# Patient Record
Sex: Female | Born: 1975 | Race: Black or African American | Hispanic: No | Marital: Married | Smoking: Never smoker
Health system: Southern US, Community
[De-identification: ages and names within clinical notes are randomized; demographics above are authoritative.]

## PROBLEM LIST (undated history)

## (undated) DIAGNOSIS — R51 Headache: Secondary | ICD-10-CM

## (undated) DIAGNOSIS — E559 Vitamin D deficiency, unspecified: Secondary | ICD-10-CM

## (undated) DIAGNOSIS — N200 Calculus of kidney: Secondary | ICD-10-CM

## (undated) DIAGNOSIS — M549 Dorsalgia, unspecified: Secondary | ICD-10-CM

## (undated) DIAGNOSIS — R002 Palpitations: Secondary | ICD-10-CM

## (undated) DIAGNOSIS — M719 Bursopathy, unspecified: Secondary | ICD-10-CM

## (undated) DIAGNOSIS — R011 Cardiac murmur, unspecified: Secondary | ICD-10-CM

## (undated) DIAGNOSIS — D649 Anemia, unspecified: Secondary | ICD-10-CM

## (undated) DIAGNOSIS — E162 Hypoglycemia, unspecified: Secondary | ICD-10-CM

## (undated) DIAGNOSIS — K219 Gastro-esophageal reflux disease without esophagitis: Secondary | ICD-10-CM

## (undated) DIAGNOSIS — R519 Headache, unspecified: Secondary | ICD-10-CM

## (undated) HISTORY — DX: Hypoglycemia, unspecified: E16.2

## (undated) HISTORY — DX: Dorsalgia, unspecified: M54.9

## (undated) HISTORY — PX: UTERINE FIBROID SURGERY: SHX826

## (undated) HISTORY — DX: Vitamin D deficiency, unspecified: E55.9

## (undated) HISTORY — DX: Headache: R51

## (undated) HISTORY — DX: Calculus of kidney: N20.0

## (undated) HISTORY — DX: Bursopathy, unspecified: M71.9

## (undated) HISTORY — DX: Headache, unspecified: R51.9

---

## 2001-01-26 ENCOUNTER — Encounter: Payer: Self-pay | Admitting: Emergency Medicine

## 2001-01-26 ENCOUNTER — Emergency Department (HOSPITAL_COMMUNITY): Admission: EM | Admit: 2001-01-26 | Discharge: 2001-01-26 | Payer: Self-pay | Admitting: Emergency Medicine

## 2002-04-23 ENCOUNTER — Emergency Department (HOSPITAL_COMMUNITY): Admission: AD | Admit: 2002-04-23 | Discharge: 2002-04-23 | Payer: Self-pay | Admitting: Emergency Medicine

## 2003-04-15 ENCOUNTER — Emergency Department (HOSPITAL_COMMUNITY): Admission: EM | Admit: 2003-04-15 | Discharge: 2003-04-16 | Payer: Self-pay | Admitting: Emergency Medicine

## 2004-03-27 ENCOUNTER — Inpatient Hospital Stay (HOSPITAL_COMMUNITY): Admission: AD | Admit: 2004-03-27 | Discharge: 2004-03-29 | Payer: Self-pay | Admitting: Obstetrics and Gynecology

## 2004-03-30 ENCOUNTER — Inpatient Hospital Stay (HOSPITAL_COMMUNITY): Admission: AD | Admit: 2004-03-30 | Discharge: 2004-03-30 | Payer: Self-pay | Admitting: Obstetrics & Gynecology

## 2004-04-04 ENCOUNTER — Inpatient Hospital Stay (HOSPITAL_COMMUNITY): Admission: AD | Admit: 2004-04-04 | Discharge: 2004-04-04 | Payer: Self-pay | Admitting: Obstetrics & Gynecology

## 2004-04-06 ENCOUNTER — Inpatient Hospital Stay (HOSPITAL_COMMUNITY): Admission: AD | Admit: 2004-04-06 | Discharge: 2004-04-10 | Payer: Self-pay | Admitting: Obstetrics and Gynecology

## 2004-05-08 ENCOUNTER — Inpatient Hospital Stay (HOSPITAL_COMMUNITY): Admission: AD | Admit: 2004-05-08 | Discharge: 2004-05-10 | Payer: Self-pay | Admitting: Obstetrics and Gynecology

## 2004-05-08 ENCOUNTER — Encounter (INDEPENDENT_AMBULATORY_CARE_PROVIDER_SITE_OTHER): Payer: Self-pay | Admitting: *Deleted

## 2004-10-24 ENCOUNTER — Emergency Department (HOSPITAL_COMMUNITY): Admission: EM | Admit: 2004-10-24 | Discharge: 2004-10-24 | Payer: Self-pay | Admitting: Family Medicine

## 2006-04-10 ENCOUNTER — Encounter: Admission: RE | Admit: 2006-04-10 | Discharge: 2006-04-10 | Payer: Self-pay | Admitting: Internal Medicine

## 2008-03-02 ENCOUNTER — Encounter: Admission: RE | Admit: 2008-03-02 | Discharge: 2008-03-02 | Payer: Self-pay | Admitting: Gastroenterology

## 2008-10-13 ENCOUNTER — Ambulatory Visit (HOSPITAL_COMMUNITY): Admission: RE | Admit: 2008-10-13 | Discharge: 2008-10-13 | Payer: Self-pay | Admitting: Obstetrics and Gynecology

## 2008-10-13 ENCOUNTER — Encounter (INDEPENDENT_AMBULATORY_CARE_PROVIDER_SITE_OTHER): Payer: Self-pay | Admitting: Obstetrics and Gynecology

## 2009-05-12 ENCOUNTER — Ambulatory Visit (HOSPITAL_COMMUNITY): Admission: RE | Admit: 2009-05-12 | Discharge: 2009-05-12 | Payer: Self-pay | Admitting: Obstetrics and Gynecology

## 2010-04-03 LAB — CBC
HCT: 37.7 % (ref 36.0–46.0)
Platelets: 269 10*3/uL (ref 150–400)
RBC: 4.47 MIL/uL (ref 3.87–5.11)

## 2010-04-20 LAB — CBC
HCT: 37.9 % (ref 36.0–46.0)
Hemoglobin: 12.8 g/dL (ref 12.0–15.0)
MCHC: 33.7 g/dL (ref 30.0–36.0)
MCV: 84 fL (ref 78.0–100.0)
Platelets: 271 10*3/uL (ref 150–400)
RBC: 4.51 MIL/uL (ref 3.87–5.11)
RDW: 13.3 % (ref 11.5–15.5)
WBC: 4.9 10*3/uL (ref 4.0–10.5)

## 2010-06-01 NOTE — H&P (Signed)
Erin Costa, Erin Costa                  ACCOUNT NO.:  [id number]   MEDICAL RECORD NO.:  [id number]          PATIENT TYPE:  INP   LOCATION:  9151                          FACILITY:  [facility]   PHYSICIAN:  Richardean Sale, M.D.   DATE OF BIRTH:  08/16/1975   DATE OF ADMISSION:  03/27/2004  DATE OF DISCHARGE:                                HISTORY & PHYSICAL   ADMISSION DIAGNOSIS:  A 32-week intrauterine pregnancy with oligohydramnios.   HISTORY OF PRESENT ILLNESS:  This is a 35 year old African American female,  gravida 1, para 0, at [redacted] weeks gestation, who underwent ultrasound  evaluation today secondary to measuring size less than dates.  The fundal  height has been at 29 cm.  The patient reports good fetal movement.  Denies  loss of fluid, vaginal bleeding or contractions.  On ultrasound today, the  fetal weight was appropriate at 23rd percentile, but the amniotic fluid  index was only 6, which is less than the 3rd percentile for gestational age.  Given this finding, a sterile speculum exam was performed in the office,  which was negative for pooling, negative nitrazine and negative fern.  The  patient is being admitted for oligohydramnios.   Prenatal care has been at [facility] with Maxie Better, M.D., as  the primary physician.   PAST MEDICAL HISTORY:  Migraines.   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   OBSTETRICAL HISTORY:  Gravida 1, para 0.   GYNECOLOGIC HISTORY:  No known abnormal Pap smears or sexually transmitted  infections.   FAMILY HISTORY:  Significant for hypertension, diabetes, Grave's disease and  prostate and lung cancer.  No known birth defects, congenital anomalies,  Down syndrome and cystic fibrosis.   SOCIAL HISTORY:  She is married.  Denies tobacco, alcohol or drugs.   MEDICATIONS:  1.  Zantac 150 mg p.o. b.i.d.  2.  Prenatal vitamins daily.  3.  Magnesium supplement daily.   ALLERGIES:  No known drug  allergies.   PRENATAL LABORATORIES:  Blood type is A positive.  Antibody screen negative.  RPR nonreactive.  Rubella immune.  Hepatitis B surface antigen nonreactive.  HIV nonreactive.  A triple test was declined.  One-hour Glucola was normal.  Anatomic survey normal at 20 weeks.   PHYSICAL EXAMINATION:  GENERAL APPEARANCE:  She is a well-developed, well-  nourished, black female who is in no acute distress.  HEART:  Regular rate and rhythm.  LUNGS:  Clear to auscultation.  ABDOMEN:  Gravid, soft and nontender.  The fundal height is 29.  Fetal heart  tones are heard.  PELVIC:  On sterile speculum exam, there is no pooling.  Negative nitrazine.  Negative fern.  The cervix is fingertip, thick, -2 station and vertex.  EXTREMITIES:  No cyanosis, clubbing or edema and nontender.   ASSESSMENT:  A 35 year old, gravida 1, para 0, black female at [redacted] weeks  gestation with new onset of oligohydramnios.  No evidence of ruptured  membranes.   PLAN:  1.  Recommend hospitalization with bed rest and intravenous fluid therapy  for 48 hours with repeat ultrasound, biophysical profile and Doppler      studies in 48 hours.  2.  Continuous fetal monitoring.      JW/MEDQ  D:  03/27/2004  T:  03/27/2004  Job:  [id number]

## 2010-06-01 NOTE — Discharge Summary (Signed)
Erin Costa, Erin Costa                  ACCOUNT NO.:  [id number]   MEDICAL RECORD NO.:  [id number]          PATIENT TYPE:  INP   LOCATION:  9158                          [facility]  [facility]   PHYSICIAN:  Maxie Better, M.D.DATE OF BIRTH:  01-Mar-1975   DATE OF ADMISSION:  04/06/2004  DATE OF DISCHARGE:  04/10/2004                                 DISCHARGE SUMMARY   ADMISSION DIAGNOSES:  1.  Recurrent oligohydramnios.  2.  Intrauterine gestation at 34 weeks.   DISCHARGE DIAGNOSES:  1.  Recurrent oligohydramnios, improved.  2.  Intrauterine gestation at 58 and four-sevenths weeks.   HISTORY OF PRESENT ILLNESS:  A 35 year old gravida 1 para 0 female at 41  weeks admitted for aggressive IV hydration secondary to recurrent  oligohydramnios. Please see the dictated H&P for specific details.   HOSPITAL COURSE:  The patient was admitted to [facility] for  aggressive IV fluids. She underwent a follow-up ultrasound to assess fluid  volume 48 hours after being hospitalized. The follow-up study showed fluid  had improved to 10.8 which was in the low-normal range. The antepartum  evaluation of the baby showed a reassuring nonstress test. The patient  continued to have her IV fluid hydration due to the fact that she had  previously been admitted for oligohydramnios and sent home in a similar  position. On hospital day #5, home infusion was set up for IV fluids at home  with follow-up amniotic fluid index planned in the office on Friday. The  patient concurred with the plan.   DISPOSITION:  Home.   CONDITION:  Stable.   FOLLOW-UP APPOINTMENT:  [facility] in a couple of days.   DISCHARGE INSTRUCTIONS:  Call for decreased fetal movement, rupture of  membranes, vaginal bleeding. Preterm labor precautions given.      [place]/MEDQ  D:  05/07/2004  T:  05/08/2004  Job:  [id number]

## 2010-06-01 NOTE — H&P (Signed)
Erin Costa, TERLECKI                  ACCOUNT NO.:  [id number]   MEDICAL RECORD NO.:  [id number]          PATIENT TYPE:  INP   LOCATION:  9158                          [facility]ILITY:  [facility]   PHYSICIAN:  Maxie Better, M.D.DATE OF BIRTH:  Sep 25, 1975   DATE OF ADMISSION:  04/06/2004  DATE OF DISCHARGE:                                HISTORY & PHYSICAL   CHIEF COMPLAINT:  Recurrent oligohydramnios.   HISTORY OF PRESENT ILLNESS:  This is a 35 year old, gravida 1, para 0,  married, black female, at 35 weeks' gestation, admitted for aggressive IV  hydration secondary to recurrent oligohydramnios.  The patient was  previously admitted, on March 27, 2004, for at least 48 hours for a similar  problem with resolution of her oligohydramnios.  She presented, on April 06, 2004, for followup of a ultrasound which had shown borderline fluid.  There  was no response to bedrest and oral hydration.  The patient is now being  readmitted for IV fluids.  Her amniotic fluid index was 8.1 which is of the  fifth percentile.  Biophysical profile was 8 out of 8.  The patient has good  fetal movements.  No leakage of fluid.   PRENATAL COURSE:  Otherwise been unremarkable.  Ultrasound, on March 27, 2004, revealed an estimated fetal weight of 1,904 grams which was in the  26th percentile.  Prenatal care is at [facility].  Primary obstetrician  is Maxie Better, M.D.   PRENATAL LABS:  Blood type is A positive.  Antibody screen is negative.  Hemoglobin electrophoresis is normal.  RPR is nonreactive.  Rubella is  immune.  Hepatitis B surface antigen is negative.  HIV test was negative.  GC and Chlamydia culture are negative.  Pap was normal.  Genetic screening  was declined.  One hour glucose test was normal.  Normal anatomic fetal  survey.   PAST MEDICAL HISTORY:  No known drug allergies.   MEDICINES:  Prenatal vitamins, Zantac.   MEDICAL HISTORY:  Migraines.   SURGICAL HISTORY:  Right breast  fibroadenoma removal.   FAMILY HISTORY:  Noncontributory.   SOCIAL HISTORY:  Married, nonsmoker.   REVIEW OF SYSTEMS:  Negative.   PHYSICAL EXAMINATION:  GENERAL:  Well-developed, well-nourished, petite,  black female.  Gravid.  No acute distress.  VITAL SIGNS:  120/66, weight 136 pounds.  SKIN:  Shows no lesions.  HEENT:  Anicteric sclerae.  Pink conjunctivae.  Oropharynx negative.  HEART:  Regular rate and rhythm without murmur.  LUNGS:  Clear to auscultation.  BREASTS:  Soft, nontender, no palpable mass.  ABDOMEN:  Gravid.  PELVIC:  Deferred.  EXTREMITIES:  No edema.   IMPRESSION:  1.  Recurrent oligohydramnios.  2.  Intrauterine gestation at 34 weeks.   PLAN:  1.  Admission.  2.  Aggressive IV hydration.  3.  Fetal surveillance.  4.  Routine labs.  5.  Bed rest with bathroom privileges.      [place]/MEDQ  D:  04/06/2004  T:  04/06/2004  Job:  [id number]

## 2014-04-25 ENCOUNTER — Encounter: Payer: Self-pay | Admitting: Dietician

## 2014-04-25 ENCOUNTER — Encounter: Payer: 59 | Attending: Obstetrics and Gynecology | Admitting: Dietician

## 2014-04-25 VITALS — Ht 62.5 in | Wt 112.5 lb

## 2014-04-25 DIAGNOSIS — E162 Hypoglycemia, unspecified: Secondary | ICD-10-CM | POA: Diagnosis present

## 2014-04-25 DIAGNOSIS — Z713 Dietary counseling and surveillance: Secondary | ICD-10-CM | POA: Diagnosis not present

## 2014-04-25 NOTE — Progress Notes (Signed)
  Medical Nutrition Therapy:  Appt start time: 0930 end time:  1025.   Assessment:  Primary concerns today: Erin Costa is here today since she was referred from her Ob/gyn since her sugar levels were updated. Hgb A1c was 5.7% recently and fasting glucose was 83 mg/dl. States she has a hx of hypoglycemia. Will have shakes of have headaches if she "doesn't eat right". Has blacked out before a long time ago. Tries to eat right but she is "on the run a lot". Might skip breakfast 3 x week and may skip other meals (lunch) too if she is busy. Tends to have hypoglycemia if she has missed meals. Will also have symptoms if she has something sweet. Has a family hx of diabetes.  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 Lives with husband and 3 children. Both she and her husband do the meal shopping/preparation at home.  Has cut back on sweet tea and sugar in coffee. Used to drink about 32 oz of sweet tea.   Preferred Learning Style:  No preference indicated   Learning Readiness:   Ready  MEDICATIONS: see list   DIETARY INTAKE:  Usual eating pattern includes 2-3 meals and 0-3 snacks per day.  Avoided foods include: brussels sprouts, octopus, not much pork or beef    24-hr recall:  B ( AM): coffee with flavored cream and sugar sometimes with splenda with banana, Kashi bar, or skips  Snk ( AM): sometimes might have peanut butter crackers or peanuts, fruit L ( PM): [place] food, goes out, brings lunch (1 x week) Snk ( PM): might have fruit D ( PM): pasta whole grain, rice, grain, vegetable, protein  Snk ( PM): sweets - ice cream, chocolate Beverages: coffee, water with flavor, 16 oz sweet tea  Usual physical activity: taking steps more, might join a running group  Estimated energy needs: 1800 calories 200 g carbohydrates 135 g protein 50 g fat  Progress Towards Goal(s):  In progress.   Nutritional Diagnosis:  [place]-2.1 Inpaired nutrition utilization As related to glucose  metabolism.  As evidenced by Hgb A1c of 5.7% and and hx of hypoglycemia.    Intervention:  Nutrition counseling provided. Plan: Aim to eat 3 meals per day and 2-3 snacks if needed. Plan out meals and snacks at night or morning. Add a protein to breakfast (boiled eggs, yogurt, protein bar).  Have a protein with carb/fruit for snacks. Try having unsweet tea with Splenda or flavored packets to water. Have a sweet along with protein and/or fiber foods (part of a meal). Increase activity wherever your can, start doing some walking at lunch (goal 150 minutes per week). Start running on Saturday morning.    Teaching Method Utilized:  Visual Auditory Hands on  Handouts given during visit include:  MyPlate Handout  15 g CHO Snacks  Meal Card  Barriers to learning/adherence to lifestyle change: busy schedule  Demonstrated degree of understanding via:  Teach Back   Monitoring/Evaluation:  Dietary intake, exercise, and body weight in 4 week(s).

## 2014-04-25 NOTE — Patient Instructions (Addendum)
Aim to eat 3 meals per day and 2-3 snacks if needed. Plan out meals and snacks at night or morning. Add a protein to breakfast (boiled eggs, yogurt, protein bar).  Have a protein with carb/fruit for snacks. Try having unsweet tea with Splenda or flavored packets to water. Have a sweet along with protein and/or fiber foods (part of a meal). Increase activity wherever your can, start doing some walking at lunch (goal 150 minutes per week). Start running on Saturday morning.

## 2014-05-25 ENCOUNTER — Ambulatory Visit: Payer: 59 | Admitting: Dietician

## 2014-10-05 ENCOUNTER — Ambulatory Visit (INDEPENDENT_AMBULATORY_CARE_PROVIDER_SITE_OTHER): Payer: 59 | Admitting: Podiatry

## 2014-10-05 ENCOUNTER — Ambulatory Visit (INDEPENDENT_AMBULATORY_CARE_PROVIDER_SITE_OTHER): Payer: 59

## 2014-10-05 VITALS — BP 105/64 | HR 70 | Resp 16

## 2014-10-05 DIAGNOSIS — M722 Plantar fascial fibromatosis: Secondary | ICD-10-CM

## 2014-10-05 DIAGNOSIS — M79671 Pain in right foot: Secondary | ICD-10-CM

## 2014-10-05 MED ORDER — DICLOFENAC SODIUM 75 MG PO TBEC: 75.0000 mg | DELAYED_RELEASE_TABLET | Freq: Two times a day (BID) | ORAL | Status: DC

## 2014-10-05 MED ORDER — TRIAMCINOLONE ACETONIDE 10 MG/ML IJ SUSP
10.0000 mg | Freq: Once | INTRAMUSCULAR | Status: AC
  Administered 2014-10-05: 10 mg

## 2014-10-05 NOTE — Progress Notes (Signed)
   Subjective:    Patient ID: Erin Costa, female    DOB: 01/16/1975, 39 y.o.   MRN: [id number]  HPI Pt presents with right foot arch and ball of foot pain x 1 week. Pain gets worse when  Prolonged standing.   Review of Systems  All other systems reviewed and are negative.      Objective:   Physical Exam        Assessment & Plan:

## 2014-10-05 NOTE — Patient Instructions (Signed)

## 2014-10-05 NOTE — Progress Notes (Signed)
Subjective:     Patient ID: Erin Costa, female   DOB: Aug 19, 1975, 39 y.o.   MRN: [id number]  HPI patient states I'm getting a lot of pain in my right arch over the last couple weeks and a my left ankle and had some pain and dropped something on it in my left foot was bothering me and I wanted to make sure it was okay. I've had periodic pain in both my feet over the years and I do think I'm probably flatfoot   Review of Systems  All other systems reviewed and are negative.      Objective:   Physical Exam  Constitutional: She is oriented to person, place, and time.  Cardiovascular: Intact distal pulses.   Musculoskeletal: Normal range of motion.  Neurological: She is oriented to person, place, and time.  Skin: Skin is warm.  Nursing note and vitals reviewed.  neurovascular status intact muscle strength adequate range of motion within normal limits. Patient's found to have good digital perfusion is well oriented 3 and moderate depression of the arch bilateral. There is discomfort in the distal plantar fascia before it inserts into the first metatarsal head and there is moderate discomfort in the dorsum of the left foot nondescript in nature. Patient does have adequate capillary fill     Assessment:     Probable distal fasciitis right with fluid buildup and moderate depression of the arch along with sprained left foot secondary to trauma    Plan:     H&P and x-rays of both feet reviewed. Today I did an injection of the distal fascia right 3 mg Kenalog 5 mg Xylocaine and applied fascial brace with instructions on usage. I gave instructions on physical therapy supportive shoes and reappoint in 1 week and we'll consider orthotics therapy

## 2014-10-12 ENCOUNTER — Ambulatory Visit (INDEPENDENT_AMBULATORY_CARE_PROVIDER_SITE_OTHER): Payer: 59 | Admitting: Podiatry

## 2014-10-12 ENCOUNTER — Encounter: Payer: Self-pay | Admitting: Podiatry

## 2014-10-12 VITALS — BP 102/70 | HR 80 | Resp 16

## 2014-10-12 DIAGNOSIS — M722 Plantar fascial fibromatosis: Secondary | ICD-10-CM

## 2014-10-12 NOTE — Progress Notes (Signed)
Subjective:     Patient ID: Erin Costa, female   DOB: 1975-11-29, 39 y.o.   MRN: [id number]  HPI patient presents stating it seems to be some improved but it still gives me trouble if him on it quite a bit   Review of Systems     Objective:   Physical Exam Neurovascular status intact muscle strength adequate with significant discomfort upon deep palpation of the distal fascia but improved from previous visit with depression of the arch noted and a moderately narrow heel noted    Assessment:     Distal plantar fasciitis right secondary to foot structure    Plan:     Advised on the importance of physical therapy and exercises and heat and ice therapy. I then went ahead and scanned for custom orthotics to reduce the stress against the arch bilateral

## 2014-10-21 ENCOUNTER — Encounter: Payer: Self-pay | Admitting: *Deleted

## 2014-10-21 NOTE — Progress Notes (Signed)
Patient ID: Erin Costa, female   DOB: 09-16-1975, 39 y.o.   MRN: [id number] Patient called office to cancel order for orthotics they are not covered by her ins so she does not want them.

## 2016-01-25 DIAGNOSIS — K146 Glossodynia: Secondary | ICD-10-CM | POA: Diagnosis not present

## 2016-01-25 DIAGNOSIS — M542 Cervicalgia: Secondary | ICD-10-CM | POA: Diagnosis not present

## 2016-01-25 DIAGNOSIS — E559 Vitamin D deficiency, unspecified: Secondary | ICD-10-CM | POA: Diagnosis not present

## 2016-01-25 DIAGNOSIS — R202 Paresthesia of skin: Secondary | ICD-10-CM | POA: Diagnosis not present

## 2016-02-16 ENCOUNTER — Other Ambulatory Visit: Payer: Self-pay | Admitting: Obstetrics and Gynecology

## 2016-02-16 ENCOUNTER — Ambulatory Visit
Admission: RE | Admit: 2016-02-16 | Discharge: 2016-02-16 | Disposition: A | Discharge status: Home / Self Care | Payer: 59 | Source: Ambulatory Visit | Attending: Obstetrics and Gynecology | Admitting: Obstetrics and Gynecology

## 2016-02-16 DIAGNOSIS — Z131 Encounter for screening for diabetes mellitus: Secondary | ICD-10-CM | POA: Diagnosis not present

## 2016-02-16 DIAGNOSIS — M549 Dorsalgia, unspecified: Secondary | ICD-10-CM

## 2016-02-16 DIAGNOSIS — R319 Hematuria, unspecified: Secondary | ICD-10-CM

## 2016-02-16 DIAGNOSIS — Z01419 Encounter for gynecological examination (general) (routine) without abnormal findings: Secondary | ICD-10-CM | POA: Diagnosis not present

## 2016-02-16 DIAGNOSIS — R310 Gross hematuria: Secondary | ICD-10-CM | POA: Diagnosis not present

## 2016-02-16 DIAGNOSIS — Z13220 Encounter for screening for lipoid disorders: Secondary | ICD-10-CM | POA: Diagnosis not present

## 2016-02-16 DIAGNOSIS — Z0000 Encounter for general adult medical examination without abnormal findings: Secondary | ICD-10-CM | POA: Diagnosis not present

## 2016-02-21 ENCOUNTER — Encounter: Payer: Self-pay | Admitting: Neurology

## 2016-02-21 ENCOUNTER — Ambulatory Visit (INDEPENDENT_AMBULATORY_CARE_PROVIDER_SITE_OTHER): Payer: 59 | Admitting: Neurology

## 2016-02-21 VITALS — BP 123/72 | HR 92 | Ht 62.0 in | Wt 113.6 lb

## 2016-02-21 DIAGNOSIS — G44209 Tension-type headache, unspecified, not intractable: Secondary | ICD-10-CM

## 2016-02-21 DIAGNOSIS — F5104 Psychophysiologic insomnia: Secondary | ICD-10-CM

## 2016-02-21 DIAGNOSIS — F419 Anxiety disorder, unspecified: Secondary | ICD-10-CM | POA: Diagnosis not present

## 2016-02-21 DIAGNOSIS — G4700 Insomnia, unspecified: Secondary | ICD-10-CM | POA: Insufficient documentation

## 2016-02-21 MED ORDER — TRAZODONE HCL 50 MG PO TABS: 50.0000 mg | ORAL_TABLET | Freq: Every evening | ORAL | 2 refills | Status: DC | PRN

## 2016-02-21 NOTE — Patient Instructions (Signed)
-   your symptoms are most likely due to tension head and stress, anxiety related. No sign of brain structural lesion at this time - no neuro imaging necessary  - relaxation, destress, good sleep are the key to get better - continue follow up with other health providers - recommend trazodone 50mg  at night for sleep aid temporarily.  - follow up in 2 months

## 2016-02-21 NOTE — Progress Notes (Signed)
NEUROLOGY CLINIC NEW PATIENT NOTE  NAME: Erin Costa DOB: 1975-12-13 REFERRING PHYSICIAN: Leighton Ruff, MD  I saw Erin Costa as a new consult in the neurovascular clinic today regarding  Chief Complaint  Patient presents with  . New Patient (Initial Visit)    New referral from Dr. Zachary George MD OBGYN MD for  right side of tongue is numbness, and right side neck area is numb. Pt sees orthopedic md for Bursitis for left hip, and plantar fascitis for left foot  [facility],[place] foot doctor for her left foot  .  HPI: Erin Costa is a 41 y.o. female with PMH of HA, left hip pain and right shoulder pain who presents as a new patient for right tongue and righ throat numbness feeling.   Pt stated that for the last several weeks, she felt right lateral tongue numbness feeling, not whole tongue but just lateral edge of the tongue. When she swallow, she can feel food down to left throat but not right throat, no choking, no speech or taste difficulty. She cannot describe the abnormal feeling at right throat, seems like things stack at right throat. Not much feeling in the morning after woke up, but as the day goes by, feeling started to appear and lasting the rest of the day.   She also complains of b/l hand/feet tingling, on and off all day long. She also complains of HA. She has long standing mild HA, could be due to allergy or sinusitis, more at frontal and temporal, or at the top of head, take allergy medication will help. However, for the last few months, HA is different, more at the back of head, throbbing, comes and goes, sometimes lasting days. Morning is better and worsening as days go by.   He complains of insomnia. Not able to get good sleep, not able to fall sleep and also not able to maintain sleep. He also has left hip pain, was told to have bursitis, neck pain and yesterday had left back of leg shoot pain. Also has right shoulder pain. 12/2014 she was  diagnosed with left foot fasciitis. Last week had renal US, was told to have renal lesion at left kidney more likely cyst. Had appointment with urology tomorrow.   Went to see PCP and checked Vit D was low but B12 normal. She works as Education officer, museum, and currently training other social workers. Not stressful. Husband has restaurant, she sometimes helps out but most of the time taking care of her children. Denies much stressor.   Denies smoking or illicit drugs. Social drinking.   Past Medical History:  Diagnosis Date  . Back pain   . Bursitis    to left hip  . Headache   . Hypoglycemia   . Vitamin D deficiency    Past Surgical History:  Procedure Laterality Date  . UTERINE FIBROID SURGERY     Family History  Problem Relation Age of Onset  . Stroke Mother   . Dementia Maternal Aunt   . Stroke Maternal Grandmother    Current Outpatient Prescriptions  Medication Sig Dispense Refill  . cetirizine (ZYRTEC) 10 MG tablet Take 10 mg by mouth daily.    . Cromolyn Sodium (NASAL ALLERGY NA) Place into the nose as needed.     Marland Kitchen levonorgestrel (MIRENA) 20 MCG/24HR IUD 1 each by Intrauterine route once.    . tacrolimus (PROTOPIC) 0.1 % ointment Apply topically 2 (two) times daily.    . Vitamin  D, Ergocalciferol, (DRISDOL) 50000 units CAPS capsule     . traZODone (DESYREL) 50 MG tablet Take 1 tablet (50 mg total) by mouth at bedtime as needed for sleep. 15 tablet 2   No current facility-administered medications for this visit.    Allergies  Allergen Reactions  . Dovonex [Calcipotriene]     rash  . Other     Dovinex cream, reaction   Social History   Social History  . Marital status: Married    Spouse name: N/A  . Number of children: N/A  . Years of education: N/A   Occupational History  . Not on file.   Social History Main Topics  . Smoking status: Never Smoker  . Smokeless tobacco: Never Used  . Alcohol use 0.6 oz/week    1 Glasses of wine per week     Comment: wine  occasionally  . Drug use: No  . Sexual activity: Not on file   Other Topics Concern  . Not on file   Social History Narrative  . No narrative on file    Review of Systems Full 14 system review of systems performed and notable only for those listed, all others are neg:  Constitutional:  fatigue Cardiovascular: palpitations Ear/Nose/Throat:   Skin: itching Eyes:   Respiratory:   Gastroitestinal:   Genitourinary: blood in urine Hematology/Lymphatic:   Endocrine: feeling hot Musculoskeletal:  Joint pain Allergy/Immunology:  allergies Neurological:  HA, numbness Psychiatric:  Sleep: insomnia   Physical Exam  Vitals:   02/21/16 1359  BP: 123/72  Pulse: 92    General - Well nourished, well developed, in no apparent distress.  Ophthalmologic - Sharp disc margins OU.  Cardiovascular - Regular rate and rhythm with no murmur. Carotid pulses were 2+ without bruits .   Neck - supple, no nuchal rigidity .  Mental Status -  Level of arousal and orientation to time, place, and person were intact. Language including expression, naming, repetition, comprehension, reading, and writing was assessed and found intact. Attention span and concentration were normal. Recent and remote memory were intact. Fund of Knowledge was assessed and was intact.  Cranial Nerves II - XII - II - Visual field intact OU. III, IV, VI - Extraocular movements intact. V - Facial sensation intact bilaterally. VII - Facial movement intact bilaterally. VIII - Hearing & vestibular intact bilaterally. X - Palate elevates symmetrically. XI - Chin turning & shoulder shrug intact bilaterally. XII - Tongue protrusion intact.  Motor Strength - The patient's strength was normal in all extremities and pronator drift was absent.  Bulk was normal and fasciculations were absent.   Motor Tone - Muscle tone was assessed at the neck and appendages and was normal.  Reflexes - The patient's reflexes were normal in  all extremities and she had no pathological reflexes.  Sensory - Light touch, temperature/pinprick were assessed and were normal.    Coordination - The patient had normal movements in the hands and feet with no ataxia or dysmetria.  Tremor was absent.  Gait and Station - The patient's transfers, posture, gait, station, and turns were observed as normal.   Imaging None   Lab Review Vit D 17.7 (low), AST/ALT neg, HDL 87, LDL 79, total chol 176, A1C 5.7   Assessment and Plan:   In summary, Erin Costa is a 41 y.o. female with PMH of HA, left hip pain and right shoulder pain who presents as a new patient for right tongue and righ throat numbness feeling as  well as back of head pain. Her symptoms are not fit to neuroanatomy, along with tension HA and symptoms better in am and worse at pm, her complains more like stress, anxiety related. Her stressor likely multifactorial including left hip pain, left leg pain, right shoulder pain, insomnia, stress from children, HA, renal lesion etc. No neuro imaging needed at this time.  - symptoms are most likely due to tension head and stress, anxiety related. Normal neuro exam, no sign of brain structural lesion at this time - no neuro imaging necessary  - relaxation, destress, good sleep are the key to get better - continue follow up with other health providers - recommend trazodone 50mg  at night for sleep aid temporarily.  - follow up in 2 months   Thank you very much for the opportunity to participate in the care of this patient.  Please do not hesitate to call if any questions or concerns arise.  No orders of the defined types were placed in this encounter.   Meds ordered this encounter  Medications  . Vitamin D, Ergocalciferol, (DRISDOL) 50000 units CAPS capsule  . traZODone (DESYREL) 50 MG tablet    Sig: Take 1 tablet (50 mg total) by mouth at bedtime as needed for sleep.    Dispense:  15 tablet    Refill:  2    Patient Instructions  -  your symptoms are most likely due to tension head and stress, anxiety related. No sign of brain structural lesion at this time - no neuro imaging necessary  - relaxation, destress, good sleep are the key to get better - continue follow up with other health providers - recommend trazodone 50mg  at night for sleep aid temporarily.  - follow up in 2 months    Rosalin Hawking, MD PhD [facility] [address], [place], [place] [address] [phone]

## 2016-02-22 ENCOUNTER — Other Ambulatory Visit: Payer: Self-pay | Admitting: Urology

## 2016-02-22 DIAGNOSIS — R310 Gross hematuria: Secondary | ICD-10-CM | POA: Diagnosis not present

## 2016-02-22 DIAGNOSIS — D3000 Benign neoplasm of unspecified kidney: Secondary | ICD-10-CM

## 2016-02-29 ENCOUNTER — Ambulatory Visit (HOSPITAL_COMMUNITY)
Admission: RE | Admit: 2016-02-29 | Discharge: 2016-02-29 | Disposition: A | Discharge status: Home / Self Care | Payer: 59 | Source: Ambulatory Visit | Attending: Urology | Admitting: Urology

## 2016-02-29 DIAGNOSIS — N289 Disorder of kidney and ureter, unspecified: Secondary | ICD-10-CM | POA: Insufficient documentation

## 2016-02-29 DIAGNOSIS — K7689 Other specified diseases of liver: Secondary | ICD-10-CM | POA: Diagnosis not present

## 2016-02-29 DIAGNOSIS — D3000 Benign neoplasm of unspecified kidney: Secondary | ICD-10-CM | POA: Diagnosis present

## 2016-02-29 MED ORDER — GADOBENATE DIMEGLUMINE 529 MG/ML IV SOLN
10.0000 mL | Freq: Once | INTRAVENOUS | Status: AC | PRN
  Administered 2016-02-29: 10 mL via INTRAVENOUS

## 2016-03-01 DIAGNOSIS — R310 Gross hematuria: Secondary | ICD-10-CM | POA: Diagnosis not present

## 2016-04-04 DIAGNOSIS — N3000 Acute cystitis without hematuria: Secondary | ICD-10-CM | POA: Diagnosis not present

## 2016-04-22 ENCOUNTER — Ambulatory Visit: Payer: 59 | Admitting: Nurse Practitioner

## 2016-04-22 ENCOUNTER — Other Ambulatory Visit: Payer: Self-pay | Admitting: Urology

## 2016-04-23 ENCOUNTER — Encounter: Payer: Self-pay | Admitting: Nurse Practitioner

## 2016-04-25 DIAGNOSIS — N3000 Acute cystitis without hematuria: Secondary | ICD-10-CM | POA: Diagnosis not present

## 2016-04-25 DIAGNOSIS — R310 Gross hematuria: Secondary | ICD-10-CM | POA: Diagnosis not present

## 2016-04-25 DIAGNOSIS — R300 Dysuria: Secondary | ICD-10-CM | POA: Diagnosis not present

## 2016-05-07 NOTE — Patient Instructions (Signed)
AANVI VOYLES  05/07/2016   Your procedure is scheduled on: 05/10/2016    Report to [facility] Main  Entrance and take [facility] elevators to 3rd Floor at 1000am.       Call this number if you have problems the morning of surgery [phone]    Remember: ONLY 1 PERSON MAY GO WITH YOU TO SHORT STAY TO GET  READY MORNING OF YOUR SURGERY.  Do not eat food or drink liquids :After Midnight.     Take these medicines the morning of surgery with A SIP OF WATER: Zyrtec, uribel                                 You may not have any metal on your body including hair pins and              piercings  Do not wear jewelry, make-up, lotions, powders or perfumes, deodorant             Do not wear nail polish.  Do not shave  48 hours prior to surgery.                 Do not bring valuables to the hospital. [place].  Contacts, dentures or bridgework may not be worn into surgery.      Patients discharged the day of surgery will not be allowed to drive home.  Name and phone number of your driver:                Please read over the following fact sheets you were given: _____________________________________________________________________             [facility] - Preparing for Surgery Before surgery, you can play an important role.  Because skin is not sterile, your skin needs to be as free of germs as possible.  You can reduce the number of germs on your skin by washing with CHG (chlorahexidine gluconate) soap before surgery.  CHG is an antiseptic cleaner which kills germs and bonds with the skin to continue killing germs even after washing. Please DO NOT use if you have an allergy to CHG or antibacterial soaps.  If your skin becomes reddened/irritated stop using the CHG and inform your nurse when you arrive at Short Stay. Do not shave (including legs and underarms) for at least 48 hours prior to the first CHG shower.  You may  shave your face/neck. Please follow these instructions carefully:  1.  Shower with CHG Soap the night before surgery and the  morning of Surgery.  2.  If you choose to wash your hair, wash your hair first as usual with your  normal  shampoo.  3.  After you shampoo, rinse your hair and body thoroughly to remove the  shampoo.                           4.  Use CHG as you would any other liquid soap.  You can apply chg directly  to the skin and wash                       Gently with a scrungie or clean  washcloth.  5.  Apply the CHG Soap to your body ONLY FROM THE NECK DOWN.   Do not use on face/ open                           Wound or open sores. Avoid contact with eyes, ears mouth and genitals (private parts).                       Wash face,  Genitals (private parts) with your normal soap.             6.  Wash thoroughly, paying special attention to the area where your surgery  will be performed.  7.  Thoroughly rinse your body with warm water from the neck down.  8.  DO NOT shower/wash with your normal soap after using and rinsing off  the CHG Soap.                9.  Pat yourself dry with a clean towel.            10.  Wear clean pajamas.            11.  Place clean sheets on your bed the night of your first shower and do not  sleep with pets. Day of Surgery : Do not apply any lotions/deodorants the morning of surgery.  Please wear clean clothes to the hospital/surgery center.  FAILURE TO FOLLOW THESE INSTRUCTIONS MAY RESULT IN THE CANCELLATION OF YOUR SURGERY PATIENT SIGNATURE_________________________________  NURSE SIGNATURE__________________________________  ________________________________________________________________________

## 2016-05-08 ENCOUNTER — Encounter (HOSPITAL_COMMUNITY): Payer: Self-pay | Admitting: *Deleted

## 2016-05-08 ENCOUNTER — Encounter (HOSPITAL_COMMUNITY)
Admission: RE | Admit: 2016-05-08 | Discharge: 2016-05-08 | Disposition: A | Discharge status: Home / Self Care | Payer: 59 | Source: Ambulatory Visit | Attending: Urology | Admitting: Urology

## 2016-05-08 DIAGNOSIS — K219 Gastro-esophageal reflux disease without esophagitis: Secondary | ICD-10-CM | POA: Diagnosis not present

## 2016-05-08 DIAGNOSIS — F419 Anxiety disorder, unspecified: Secondary | ICD-10-CM | POA: Diagnosis not present

## 2016-05-08 DIAGNOSIS — N200 Calculus of kidney: Secondary | ICD-10-CM | POA: Diagnosis present

## 2016-05-08 HISTORY — DX: Gastro-esophageal reflux disease without esophagitis: K21.9

## 2016-05-08 HISTORY — DX: Anemia, unspecified: D64.9

## 2016-05-08 HISTORY — DX: Palpitations: R00.2

## 2016-05-08 HISTORY — DX: Cardiac murmur, unspecified: R01.1

## 2016-05-08 LAB — CBC
HCT: 35.9 % — ABNORMAL LOW (ref 36.0–46.0)
HEMOGLOBIN: 11.8 g/dL — AB (ref 12.0–15.0)
MCH: 27.6 pg (ref 26.0–34.0)
MCHC: 32.9 g/dL (ref 30.0–36.0)
MCV: 84.1 fL (ref 78.0–100.0)
PLATELETS: 224 10*3/uL (ref 150–400)
RBC: 4.27 MIL/uL (ref 3.87–5.11)
RDW: 13.2 % (ref 11.5–15.5)
WBC: 5.2 10*3/uL (ref 4.0–10.5)

## 2016-05-08 LAB — BASIC METABOLIC PANEL
ANION GAP: 9 (ref 5–15)
BUN: 19 mg/dL (ref 6–20)
CALCIUM: 9 mg/dL (ref 8.9–10.3)
CO2: 24 mmol/L (ref 22–32)
CREATININE: 0.73 mg/dL (ref 0.44–1.00)
Chloride: 105 mmol/L (ref 101–111)
Glucose, Bld: 187 mg/dL — ABNORMAL HIGH (ref 65–99)
Potassium: 3.8 mmol/L (ref 3.5–5.1)
SODIUM: 138 mmol/L (ref 135–145)

## 2016-05-08 LAB — HCG, SERUM, QUALITATIVE: PREG SERUM: NEGATIVE

## 2016-05-10 ENCOUNTER — Ambulatory Visit (HOSPITAL_COMMUNITY): Payer: 59 | Admitting: Anesthesiology

## 2016-05-10 ENCOUNTER — Ambulatory Visit (HOSPITAL_COMMUNITY)
Admission: RE | Admit: 2016-05-10 | Discharge: 2016-05-10 | Disposition: A | Discharge status: Home / Self Care | Payer: 59 | Source: Ambulatory Visit | Attending: Urology | Admitting: Urology

## 2016-05-10 ENCOUNTER — Encounter (HOSPITAL_COMMUNITY)
Admission: RE | Disposition: A | Discharge status: Home / Self Care | Payer: Self-pay | Source: Ambulatory Visit | Attending: Urology

## 2016-05-10 ENCOUNTER — Ambulatory Visit (HOSPITAL_COMMUNITY): Payer: 59

## 2016-05-10 DIAGNOSIS — G4700 Insomnia, unspecified: Secondary | ICD-10-CM | POA: Diagnosis not present

## 2016-05-10 DIAGNOSIS — N200 Calculus of kidney: Secondary | ICD-10-CM | POA: Diagnosis not present

## 2016-05-10 DIAGNOSIS — F419 Anxiety disorder, unspecified: Secondary | ICD-10-CM | POA: Insufficient documentation

## 2016-05-10 DIAGNOSIS — K219 Gastro-esophageal reflux disease without esophagitis: Secondary | ICD-10-CM | POA: Diagnosis not present

## 2016-05-10 DIAGNOSIS — R51 Headache: Secondary | ICD-10-CM | POA: Diagnosis not present

## 2016-05-10 HISTORY — PX: CYSTOSCOPY WITH RETROGRADE PYELOGRAM, URETEROSCOPY AND STENT PLACEMENT: SHX5789

## 2016-05-10 SURGERY — CYSTOURETEROSCOPY, WITH RETROGRADE PYELOGRAM AND STENT INSERTION
Anesthesia: General | Laterality: Bilateral

## 2016-05-10 MED ORDER — MIDAZOLAM HCL 5 MG/5ML IJ SOLN
INTRAMUSCULAR | Status: DC | PRN
  Administered 2016-05-10: 2 mg via INTRAVENOUS

## 2016-05-10 MED ORDER — LIDOCAINE 2% (20 MG/ML) 5 ML SYRINGE
INTRAMUSCULAR | Status: DC | PRN
  Administered 2016-05-10: 100 mg via INTRAVENOUS

## 2016-05-10 MED ORDER — OXYCODONE-ACETAMINOPHEN 5-325 MG PO TABS
1.0000 | ORAL_TABLET | Freq: Once | ORAL | Status: AC
  Administered 2016-05-10: 1 via ORAL
  Filled 2016-05-10: qty 1

## 2016-05-10 MED ORDER — OXYCODONE-ACETAMINOPHEN 5-325 MG PO TABS: 1.0000 | ORAL_TABLET | ORAL | 0 refills | Status: DC | PRN

## 2016-05-10 MED ORDER — PROPOFOL 10 MG/ML IV BOLUS
INTRAVENOUS | Status: DC | PRN
  Administered 2016-05-10: 100 mg via INTRAVENOUS

## 2016-05-10 MED ORDER — IOHEXOL 300 MG/ML  SOLN
INTRAMUSCULAR | Status: DC | PRN
  Administered 2016-05-10: 10 mL via URETHRAL

## 2016-05-10 MED ORDER — LIDOCAINE 2% (20 MG/ML) 5 ML SYRINGE
INTRAMUSCULAR | Status: AC
  Filled 2016-05-10: qty 5

## 2016-05-10 MED ORDER — FENTANYL CITRATE (PF) 100 MCG/2ML IJ SOLN
INTRAMUSCULAR | Status: AC
  Filled 2016-05-10: qty 2

## 2016-05-10 MED ORDER — LACTATED RINGERS IV SOLN
INTRAVENOUS | Status: DC
  Administered 2016-05-10: 11:00:00 via INTRAVENOUS

## 2016-05-10 MED ORDER — HYDROMORPHONE HCL 1 MG/ML IJ SOLN
INTRAMUSCULAR | Status: AC
  Filled 2016-05-10: qty 1

## 2016-05-10 MED ORDER — PROMETHAZINE HCL 25 MG/ML IJ SOLN: 6.2500 mg | INTRAMUSCULAR | Status: DC | PRN

## 2016-05-10 MED ORDER — ONDANSETRON HCL 4 MG/2ML IJ SOLN
INTRAMUSCULAR | Status: AC
  Filled 2016-05-10: qty 2

## 2016-05-10 MED ORDER — MIDAZOLAM HCL 2 MG/2ML IJ SOLN
INTRAMUSCULAR | Status: AC
  Filled 2016-05-10: qty 2

## 2016-05-10 MED ORDER — SCOPOLAMINE 1 MG/3DAYS TD PT72
MEDICATED_PATCH | TRANSDERMAL | Status: AC
  Filled 2016-05-10: qty 1

## 2016-05-10 MED ORDER — HYDROMORPHONE HCL 1 MG/ML IJ SOLN
0.2500 mg | INTRAMUSCULAR | Status: DC | PRN
Start: 2016-05-10 — End: 2016-05-10
  Administered 2016-05-10 (×3): 0.5 mg via INTRAVENOUS

## 2016-05-10 MED ORDER — MEPERIDINE HCL 50 MG/ML IJ SOLN: 6.2500 mg | INTRAMUSCULAR | Status: DC | PRN

## 2016-05-10 MED ORDER — SCOPOLAMINE 1 MG/3DAYS TD PT72
1.0000 | MEDICATED_PATCH | TRANSDERMAL | Status: DC
  Administered 2016-05-10: 1.5 mg via TRANSDERMAL

## 2016-05-10 MED ORDER — FENTANYL CITRATE (PF) 100 MCG/2ML IJ SOLN
INTRAMUSCULAR | Status: DC | PRN
  Administered 2016-05-10: 50 ug via INTRAVENOUS

## 2016-05-10 MED ORDER — CEFAZOLIN SODIUM-DEXTROSE 2-4 GM/100ML-% IV SOLN
2.0000 g | INTRAVENOUS | Status: AC
  Administered 2016-05-10: 2 g via INTRAVENOUS
  Filled 2016-05-10: qty 100

## 2016-05-10 MED ORDER — HYDROMORPHONE HCL 1 MG/ML IJ SOLN
INTRAMUSCULAR | Status: AC
  Administered 2016-05-10: 0.5 mg via INTRAVENOUS
  Filled 2016-05-10: qty 1

## 2016-05-10 MED ORDER — DEXAMETHASONE SODIUM PHOSPHATE 10 MG/ML IJ SOLN
INTRAMUSCULAR | Status: AC
  Filled 2016-05-10: qty 1

## 2016-05-10 MED ORDER — PROPOFOL 10 MG/ML IV BOLUS
INTRAVENOUS | Status: AC
  Filled 2016-05-10: qty 20

## 2016-05-10 MED ORDER — SODIUM CHLORIDE 0.9 % IR SOLN
Status: DC | PRN
  Administered 2016-05-10: 4000 mL via INTRAVESICAL

## 2016-05-10 MED ORDER — ONDANSETRON HCL 4 MG/2ML IJ SOLN
INTRAMUSCULAR | Status: DC | PRN
  Administered 2016-05-10: 4 mg via INTRAVENOUS

## 2016-05-10 MED ORDER — DEXAMETHASONE SODIUM PHOSPHATE 10 MG/ML IJ SOLN
INTRAMUSCULAR | Status: DC | PRN
  Administered 2016-05-10: 10 mg via INTRAVENOUS

## 2016-05-10 SURGICAL SUPPLY — 29 items
BAG URO CATCHER STRL LF (MISCELLANEOUS) ×2 IMPLANT
BASKET DAKOTA 1.9FR 11X120 (BASKET) IMPLANT
BASKET LASER NITINOL 1.9FR (BASKET) IMPLANT
BSKT STON RTRVL 120 1.9FR (BASKET)
CATH FOLEY LATEX FREE 20FR (CATHETERS)
CATH FOLEY LF 20FR (CATHETERS) IMPLANT
CATH INTERMIT  6FR 70CM (CATHETERS) ×2 IMPLANT
CLOTH BEACON ORANGE TIMEOUT ST (SAFETY) ×2 IMPLANT
COVER SURGICAL LIGHT HANDLE (MISCELLANEOUS) ×1 IMPLANT
EXTRACTOR STONE NITINOL NGAGE (UROLOGICAL SUPPLIES) ×1 IMPLANT
FIBER LASER FLEXIVA 1000 (UROLOGICAL SUPPLIES) IMPLANT
FIBER LASER FLEXIVA 365 (UROLOGICAL SUPPLIES) IMPLANT
FIBER LASER FLEXIVA 550 (UROLOGICAL SUPPLIES) IMPLANT
FIBER LASER TRAC TIP (UROLOGICAL SUPPLIES) IMPLANT
GLOVE BIO SURGEON STRL SZ8 (GLOVE) ×2 IMPLANT
GOWN STRL REUS W/TWL XL LVL3 (GOWN DISPOSABLE) ×2 IMPLANT
GUIDEWIRE ANG ZIPWIRE 038X150 (WIRE) ×2 IMPLANT
GUIDEWIRE STR DUAL SENSOR (WIRE) ×2 IMPLANT
IV NS 1000ML (IV SOLUTION)
IV NS 1000ML BAXH (IV SOLUTION) ×1 IMPLANT
MANIFOLD NEPTUNE II (INSTRUMENTS) ×2 IMPLANT
PACK CYSTO (CUSTOM PROCEDURE TRAY) ×2 IMPLANT
SHEATH ACCESS URETERAL 38CM (SHEATH) IMPLANT
STENT CONTOUR 6FRX26X.038 (STENTS) IMPLANT
SYR 10ML LL (SYRINGE) ×1 IMPLANT
SYR CONTROL 10ML LL (SYRINGE) ×1 IMPLANT
SYRINGE IRR TOOMEY STRL 70CC (SYRINGE) IMPLANT
TUBE FEEDING 8FR 16IN STR KANG (MISCELLANEOUS) IMPLANT
TUBING CONNECTING 10 (TUBING) ×2 IMPLANT

## 2016-05-10 NOTE — Progress Notes (Signed)
Waiting on patient's ride home (husband).

## 2016-05-10 NOTE — Discharge Instructions (Signed)
Ureteroscopy, Care After This sheet gives you information about how to care for yourself after your procedure. Your health care provider may also give you more specific instructions. If you have problems or questions, contact your health care provider. What can I expect after the procedure? After the procedure, it is common to have:  A burning sensation when you urinate.  Blood in your urine.  Mild discomfort in the bladder area or kidney area when urinating.  Needing to urinate more often or urgently. Follow these instructions at home: Medicines   Take over-the-counter and prescription medicines only as told by your health care provider.  If you were prescribed an antibiotic medicine, take it as told by your health care provider. Do not stop taking the antibiotic even if you start to feel better. General instructions    Donot drive for 24 hours if you were given a medicine to help you relax (sedative) during your procedure.  To relieve burning, try taking a warm bath or holding a warm washcloth over your groin.  Drink enough fluid to keep your urine clear or pale yellow.  Drink two 8-ounce glasses of water every hour for the first 2 hours after you get home.  Continue to drink water often at home.  You can eat what you usually do.  Keep all follow-up visits as told by your health care provider. This is important.  If you had a tube placed to keep urine flowing (ureteral stent), ask your health care provider when you need to return to have it removed. Contact a health care provider if:  You have chills or a fever.  You have burning pain for longer than 24 hours after the procedure.  You have blood in your urine for longer than 24 hours after the procedure. Get help right away if:  You have large amounts of blood in your urine.  You have blood clots in your urine.  You have very bad pain.  You have chest pain or trouble breathing.  You are unable to urinate and  you have the feeling of a full bladder. This information is not intended to replace advice given to you by your health care provider. Make sure you discuss any questions you have with your health care provider. Document Released: 01/05/2013 Document Revised: 10/17/2015 Document Reviewed: 10/13/2015 Elsevier Interactive Patient Education  2017 [place] Anesthesia, Adult, Care After These instructions provide you with information about caring for yourself after your procedure. Your health care provider may also give you more specific instructions. Your treatment has been planned according to current medical practices, but problems sometimes occur. Call your health care provider if you have any problems or questions after your procedure. What can I expect after the procedure? After the procedure, it is common to have:  Vomiting.  A sore throat.  Mental slowness. It is common to feel:  Nauseous.  Cold or shivery.  Sleepy.  Tired.  Sore or achy, even in parts of your body where you did not have surgery. Follow these instructions at home: For at least 24 hours after the procedure:   Do not:  Participate in activities where you could fall or become injured.  Drive.  Use heavy machinery.  Drink alcohol.  Take sleeping pills or medicines that cause drowsiness.  Make important decisions or sign legal documents.  Take care of children on your own.  Rest. Eating and drinking   If you vomit, drink water, juice, or soup when you  can drink without vomiting.  Drink enough fluid to keep your urine clear or pale yellow.  Make sure you have little or no nausea before eating solid foods.  Follow the diet recommended by your health care provider. General instructions   Have a responsible adult stay with you until you are awake and alert.  Return to your normal activities as told by your health care provider. Ask your health care provider what activities are safe  for you.  Take over-the-counter and prescription medicines only as told by your health care provider.  If you smoke, do not smoke without supervision.  Keep all follow-up visits as told by your health care provider. This is important. Contact a health care provider if:  You continue to have nausea or vomiting at home, and medicines are not helpful.  You cannot drink fluids or start eating again.  You cannot urinate after 8-12 hours.  You develop a skin rash.  You have fever.  You have increasing redness at the site of your procedure. Get help right away if:  You have difficulty breathing.  You have chest pain.  You have unexpected bleeding.  You feel that you are having a life-threatening or urgent problem. This information is not intended to replace advice given to you by your health care provider. Make sure you discuss any questions you have with your health care provider. Document Released: 04/08/2000 Document Revised: 06/05/2015 Document Reviewed: 12/15/2014 Elsevier Interactive Patient Education  2017 [employer].

## 2016-05-10 NOTE — Transfer of Care (Signed)
Immediate Anesthesia Transfer of Care Note  Patient: Erin Costa  Procedure(s) Performed: Procedure(s): CYSTOSCOPY WITH RETROGRADE PYELOGRAM, URETEROSCOPY, BASKET EXTRACTION OF STONE ON LEFT (Bilateral)  Patient Location: PACU  Anesthesia Type:General  Level of Consciousness: sedated  Airway & Oxygen Therapy: Patient Spontanous Breathing and Patient connected to face mask oxygen  Post-op Assessment: Report given to RN and Post -op Vital signs reviewed and stable  Post vital signs: Reviewed and stable  Last Vitals:  Vitals:   05/10/16 1018  BP: 122/70  Pulse: 65  Resp: 18  Temp: 36.5 C    Last Pain:  Vitals:   05/10/16 1054  TempSrc:   PainSc: 5          Complications: No apparent anesthesia complications

## 2016-05-10 NOTE — Anesthesia Preprocedure Evaluation (Signed)
Anesthesia Evaluation  Patient identified by MRN, date of birth, ID band Patient awake    Reviewed: Allergy & Precautions, NPO status , Patient's Chart, lab work & pertinent test results  Airway Mallampati: II  TM Distance: >3 FB Neck ROM: Full    Dental no notable dental hx.    Pulmonary neg pulmonary ROS,    Pulmonary exam normal breath sounds clear to auscultation       Cardiovascular negative cardio ROS Normal cardiovascular exam+ Valvular Problems/Murmurs  Rhythm:Regular Rate:Normal     Neuro/Psych  Headaches, PSYCHIATRIC DISORDERS Anxiety    GI/Hepatic Neg liver ROS, GERD  ,  Endo/Other  negative endocrine ROS  Renal/GU negative Renal ROS     Musculoskeletal negative musculoskeletal ROS (+)   Abdominal   Peds  Hematology  (+) anemia ,   Anesthesia Other Findings   Reproductive/Obstetrics negative OB ROS                             Anesthesia Physical Anesthesia Plan  ASA: II  Anesthesia Plan: General   Post-op Pain Management:    Induction: Intravenous  Airway Management Planned: LMA  Additional Equipment:   Intra-op Plan:   Post-operative Plan: Extubation in OR  Informed Consent: I have reviewed the patients History and Physical, chart, labs and discussed the procedure including the risks, benefits and alternatives for the proposed anesthesia with the patient or authorized representative who has indicated his/her understanding and acceptance.   Dental advisory given  Plan Discussed with: CRNA  Anesthesia Plan Comments:         Anesthesia Quick Evaluation

## 2016-05-10 NOTE — Anesthesia Postprocedure Evaluation (Signed)
Anesthesia Post Note  Patient: Erin Costa  Procedure(s) Performed: Procedure(s) (LRB): CYSTOSCOPY WITH RETROGRADE PYELOGRAM, URETEROSCOPY, BASKET EXTRACTION OF STONE ON LEFT (Bilateral)  Patient location during evaluation: PACU Anesthesia Type: General Level of consciousness: sedated and patient cooperative Pain management: pain level controlled Vital Signs Assessment: post-procedure vital signs reviewed and stable Respiratory status: spontaneous breathing Cardiovascular status: stable Anesthetic complications: no       Last Vitals:  Vitals:   05/10/16 1311 05/10/16 1315  BP: (!) 111/55 (!) 105/56  Pulse: 78 78  Resp: 16 15  Temp: 36.4 C     Last Pain:  Vitals:   05/10/16 1330  TempSrc:   PainSc: [place]

## 2016-05-10 NOTE — Anesthesia Procedure Notes (Signed)
Procedure Name: LMA Insertion Date/Time: 05/10/2016 12:18 PM Performed by: Lind Covert Pre-anesthesia Checklist: Patient identified, Emergency Drugs available, Suction available, Patient being monitored and Timeout performed Patient Re-evaluated:Patient Re-evaluated prior to inductionOxygen Delivery Method: Circle system utilized Preoxygenation: Pre-oxygenation with 100% oxygen Intubation Type: IV induction LMA: LMA inserted LMA Size: 4.0 Number of attempts: 1 Placement Confirmation: positive ETCO2 and breath sounds checked- equal and bilateral Tube secured with: Tape Dental Injury: Teeth and Oropharynx as per pre-operative assessment

## 2016-05-10 NOTE — H&P (Signed)
Urology Admission H&P  Chief Complaint: gross hematuria  History of Present Illness: Erin Costa is a 41yo with a hx of gross hematuria.  Past Medical History:  Diagnosis Date  . Anemia    hx of   . Back pain   . Bursitis    to left hip  . GERD (gastroesophageal reflux disease)   . Headache   . Heart murmur   . Hypoglycemia   . Hypoglycemia   . Palpitations   . Vitamin D deficiency    Past Surgical History:  Procedure Laterality Date  . UTERINE FIBROID SURGERY      Home Medications:  Prescriptions Prior to Admission  Medication Sig Dispense Refill Last Dose  . cefpodoxime (VANTIN) 200 MG tablet Take 200 mg by mouth 2 (two) times daily. 7 day course started 04-26-16 UTI Is still currently taking   05/09/2016  . cetirizine (ZYRTEC) 10 MG tablet Take 10 mg by mouth daily.   05/10/2016 at 1215  . Melatonin 5 MG CAPS Take 10 mg by mouth at bedtime as needed (sleep).   Past Week at Unknown time  . Meth-Hyo-M Bl-Na Phos-Ph Sal (URIBEL) 118 MG CAPS Take 118 mg by mouth daily.   Past Week at Unknown time  . Rhodiola rosea (RHODIOLA PO) Take by mouth daily. Uses 2 droppers in water   Past Week at Unknown time  . tacrolimus (PROTOPIC) 0.1 % ointment Apply 1 application topically daily.    05/09/2016 at Unknown time  . Vitamin D, Ergocalciferol, (DRISDOL) 50000 units CAPS capsule Take 50,000 Units by mouth every 7 (seven) days. Sunday Stopped for procedure   Past Month at Unknown time  . levonorgestrel (MIRENA) 20 MCG/24HR IUD 1 each by Intrauterine route once.   Taking  . traZODone (DESYREL) 50 MG tablet Take 1 tablet (50 mg total) by mouth at bedtime as needed for sleep. (Patient not taking: Reported on 05/07/2016) 15 tablet 2 Not Taking at Unknown time   Allergies:  Allergies  Allergen Reactions  . Dovonex [Calcipotriene]     rash  . Other     Dovinex cream, reaction    Family History  Problem Relation Age of Onset  . Stroke Mother   . Dementia Maternal Aunt   . Stroke Maternal  Grandmother    Social History:  reports that she has never smoked. She has never used smokeless tobacco. She reports that she drinks about 0.6 oz of alcohol per week . She reports that she does not use drugs.  Review of Systems  Genitourinary: Positive for hematuria.  All other systems reviewed and are negative.   Physical Exam:  Vital signs in last 24 hours: Temp:  [97.7 F (36.5 C)] 97.7 F (36.5 C) (04/27 1018) Pulse Rate:  [65] 65 (04/27 1018) Resp:  [18] 18 (04/27 1018) BP: (122)/(70) 122/70 (04/27 1018) SpO2:  [100 %] 100 % (04/27 1018) Weight:  [50.8 kg (112 lb)] 50.8 kg (112 lb) (04/27 1054) Physical Exam  Constitutional: She is oriented to person, place, and time. She appears well-developed and well-nourished.  HENT:  Head: Normocephalic and atraumatic.  Eyes: EOM are normal. Pupils are equal, round, and reactive to light.  Neck: Normal range of motion. No thyromegaly present.  Cardiovascular: Normal rate and regular rhythm.   Respiratory: Effort normal. No respiratory distress.  GI: Soft. She exhibits no distension.  Musculoskeletal: Normal range of motion. She exhibits no edema.  Neurological: She is alert and oriented to person, place, and time.  Skin:  Skin is warm and dry.  Psychiatric: She has a normal mood and affect. Her behavior is normal. Judgment and thought content normal.    Laboratory Data:  No results found for this or any previous visit (from the past 24 hour(s)). No results found for this or any previous visit (from the past 240 hour(s)). Creatinine:  Recent Labs  05/08/16 1431  CREATININE 0.73   Baseline Creatinine: 0.7  Impression/Assessment:  41yo with gross hematuria  Plan:  The risks/benefits/alternaitves to cystoscopy, bilateral ureteroscopy, possible fulgeration was explained to the patient and she understands and wishes to proceed with surgery  Nicolette Bang 05/10/2016, 12:11 PM

## 2016-05-10 NOTE — Brief Op Note (Signed)
05/10/2016  1:04 PM  PATIENT:  Erin Costa  41 y.o. female  PRE-OPERATIVE DIAGNOSIS:  GROSS HEMATURIA  POST-OPERATIVE DIAGNOSIS:  GROSS HEMATURIA  PROCEDURE:  Procedure(s): CYSTOSCOPY WITH RETROGRADE PYELOGRAM, URETEROSCOPY, BASKET EXTRACTION OF STONE ON LEFT (Bilateral)  SURGEON:  Surgeon(s) and Role:    * Cleon Gustin, MD - Primary  PHYSICIAN ASSISTANT:   ASSISTANTS: none   ANESTHESIA:   general  EBL:  Total I/O In: 700 [I.V.:700] Out: -   BLOOD ADMINISTERED:none  DRAINS: none   LOCAL MEDICATIONS USED:  NONE  SPECIMEN:  Source of Specimen:  left renal calculus  DISPOSITION OF SPECIMEN:  N/A  COUNTS:  YES  TOURNIQUET:  * No tourniquets in log *  DICTATION: .Note written in EPIC  PLAN OF CARE: Discharge to home after PACU  PATIENT DISPOSITION:  PACU - hemodynamically stable.   Delay start of Pharmacological VTE agent (>24hrs) due to surgical blood loss or risk of bleeding: not applicable

## 2016-05-12 ENCOUNTER — Encounter (HOSPITAL_COMMUNITY): Payer: Self-pay

## 2016-05-12 ENCOUNTER — Emergency Department (HOSPITAL_COMMUNITY)
Admission: EM | Admit: 2016-05-12 | Discharge: 2016-05-13 | Disposition: A | Discharge status: Home / Self Care | Payer: 59 | Attending: Emergency Medicine | Admitting: Emergency Medicine

## 2016-05-12 DIAGNOSIS — R319 Hematuria, unspecified: Secondary | ICD-10-CM | POA: Diagnosis present

## 2016-05-12 DIAGNOSIS — R109 Unspecified abdominal pain: Secondary | ICD-10-CM | POA: Diagnosis not present

## 2016-05-12 DIAGNOSIS — R310 Gross hematuria: Secondary | ICD-10-CM | POA: Insufficient documentation

## 2016-05-12 DIAGNOSIS — Z79899 Other long term (current) drug therapy: Secondary | ICD-10-CM | POA: Diagnosis not present

## 2016-05-12 NOTE — ED Provider Notes (Signed)
[facility] DEPT Provider Note   CSN: [id number] Arrival date & time: 05/12/16  2317  By signing my name below, I, Erin Costa, attest that this documentation has been prepared under the direction and in the presence of Erin Essex, MD. Electronically Signed: Oleh Costa, Scribe. 05/12/16. 11:43 PM.   History   Chief Complaint Chief Complaint  Patient presents with  . Hematuria    HPI Erin Costa is a 41 y.o. female with history of anemia who presents to the ED with hematuria. This patient was seen 4 days ago at urology clinic for ongoing hematuria. At the time she had a cystoscopy where she was found to have "several bleeding vessels" and incidentally a renal stone. The stone was removed, however the vessels were not cauterized because "it would do more harm that good", according to the patient's husband. Since that procedure she has had increasing volume of her hematuria. Previously was "very small", however now her urine is grossly blood. She is reporting L flank pain at interview which is worse since her procedure. She is reporting urinary frequency and urgency as well as dysuria. She is not anticoagulated. No vaginal bleeding. She is on Mirena. She is taking percocet at home with mild improvement.   The history is provided by the patient. No language interpreter was used.  Hematuria  This is a new problem. The current episode started more than 2 days ago. The problem has been gradually worsening. Nothing aggravates the symptoms. The symptoms are relieved by narcotics.    Past Medical History:  Diagnosis Date  . Anemia    hx of   . Back pain   . Bursitis    to left hip  . GERD (gastroesophageal reflux disease)   . Headache   . Heart murmur   . Hypoglycemia   . Hypoglycemia   . Palpitations   . Vitamin D deficiency     Patient Active Problem List   Diagnosis Date Noted  . Insomnia 02/21/2016  . Anxiety 02/21/2016  . Tension headache 02/21/2016     Past Surgical History:  Procedure Laterality Date  . UTERINE FIBROID SURGERY      OB History    No data available       Home Medications    Prior to Admission medications   Medication Sig Start Date End Date Taking? Authorizing Provider  cefpodoxime (VANTIN) 200 MG tablet Take 200 mg by mouth 2 (two) times daily. 7 day course started 04-26-16 UTI Is still currently taking    Historical Provider, MD  cetirizine (ZYRTEC) 10 MG tablet Take 10 mg by mouth daily.    Historical Provider, MD  levonorgestrel (MIRENA) 20 MCG/24HR IUD 1 each by Intrauterine route once.    Historical Provider, MD  Melatonin 5 MG CAPS Take 10 mg by mouth at bedtime as needed (sleep).    Historical Provider, MD  Meth-Hyo-M Bl-Na Phos-Ph Sal (URIBEL) 118 MG CAPS Take 118 mg by mouth daily.    Historical Provider, MD  oxyCODONE-acetaminophen (ROXICET) 5-325 MG tablet Take 1 tablet by mouth every 4 (four) hours as needed for severe pain. 05/10/16   Cleon Gustin, MD  Rhodiola rosea (RHODIOLA PO) Take by mouth daily. Uses 2 droppers in water    Historical Provider, MD  tacrolimus (PROTOPIC) 0.1 % ointment Apply 1 application topically daily.     Historical Provider, MD  Vitamin D, Ergocalciferol, (DRISDOL) 50000 units CAPS capsule Take 50,000 Units by mouth every 7 (seven) days. Sunday Stopped  for procedure 02/11/16   Historical Provider, MD    Family History Family History  Problem Relation Age of Onset  . Stroke Mother   . Dementia Maternal Aunt   . Stroke Maternal Grandmother     Social History Social History  Substance Use Topics  . Smoking status: Never Smoker  . Smokeless tobacco: Never Used  . Alcohol use 0.6 oz/week    1 Glasses of wine per week     Comment: wine occasionally     Allergies   Dovonex [calcipotriene] and Other   Review of Systems Review of Systems  Genitourinary: Positive for flank pain, frequency, hematuria and urgency. Negative for vaginal bleeding.  All other  systems reviewed and are negative.    Physical Exam Updated Vital Signs BP 125/75 (BP Location: Left Arm)   Pulse 88   Temp 98.7 F (37.1 C) (Oral)   Resp 18   SpO2 99%   Physical Exam  Constitutional: She is oriented to person, place, and time. She appears well-developed and well-nourished. No distress.  HENT:  Head: Normocephalic and atraumatic.  Mouth/Throat: Oropharynx is clear and moist. No oropharyngeal exudate.  Eyes: Conjunctivae and EOM are normal. Pupils are equal, round, and reactive to light.  Neck: Normal range of motion. Neck supple.  No meningismus.  Cardiovascular: Normal rate, regular rhythm, normal heart sounds and intact distal pulses.   No murmur heard. Pulmonary/Chest: Effort normal and breath sounds normal. No respiratory distress.  Abdominal: Soft. There is no rebound and no guarding.  The suprapubic area is tender with mild distention.   Musculoskeletal: Normal range of motion. She exhibits no edema.  L CVA tenderness.   Neurological: She is alert and oriented to person, place, and time. No cranial nerve deficit. She exhibits normal muscle tone. Coordination normal.   5/5 strength throughout. CN 2-12 intact.Equal grip strength.   Skin: Skin is warm.  Psychiatric: She has a normal mood and affect. Her behavior is normal.  Nursing note and vitals reviewed.    ED Treatments / Results  Labs (all labs ordered are listed, but only abnormal results are displayed) Labs Reviewed  URINALYSIS, ROUTINE W REFLEX MICROSCOPIC - Abnormal; Notable for the following:       Result Value   Color, Urine RED (*)    APPearance HAZY (*)    Hgb urine dipstick LARGE (*)    Protein, ur 100 (*)    Squamous Epithelial / LPF 0-5 (*)    All other components within normal limits  BASIC METABOLIC PANEL - Abnormal; Notable for the following:    BUN 24 (*)    All other components within normal limits  URINE CULTURE  PREGNANCY, URINE  CBC WITH DIFFERENTIAL/PLATELET     EKG  EKG Interpretation None       Radiology Ct Renal Stone Study  Result Date: 05/13/2016 CLINICAL DATA:  Cystoscopy 4 days ago with removal of kidney stone gross hematuria and increased left flank pain EXAM: CT ABDOMEN AND PELVIS WITHOUT CONTRAST TECHNIQUE: Multidetector CT imaging of the abdomen and pelvis was performed following the standard protocol without IV contrast. COMPARISON:  MRI 02/29/2016, CT 03/02/2008 FINDINGS: Lower chest: Lung bases demonstrate no acute consolidation or effusion. Heart size is within normal limits. Hepatobiliary: No focal liver abnormality is seen. MRI demonstrated liver cysts are not well appreciated. No gallstones, gallbladder wall thickening, or biliary dilatation. Pancreas: Unremarkable. No pancreatic ductal dilatation or surrounding inflammatory changes. Spleen: Normal in size without focal abnormality. Adrenals/Urinary Tract: Adrenal  glands are within normal limits. Multiple old stones are present in the right kidney, the largest is seen in the lower pole an measures 6 mm on axial images. No right hydronephrosis. There are punctate stones in the lower pole of the left kidney. There is mild left hydronephrosis and hydroureter, no definitive stones is seen along the course of the left ureter. The bladder is unremarkable. Stomach/Bowel: Stomach is within normal limits. Appendix appears normal. No evidence of bowel wall thickening, distention, or inflammatory changes. Vascular/Lymphatic: No significant vascular findings are present. No enlarged abdominal or pelvic lymph nodes. Reproductive: Intrauterine device is present. 3.4 cm cyst in the right ovary. Other: No free air or free fluid. Musculoskeletal: No acute or significant osseous findings. IMPRESSION: 1. Multiple bilateral intrarenal calculi. Mild left hydronephrosis and hydroureter, however no definitive stones are visualized along the course of the left ureter. Electronically Signed   By: Donavan Foil M.D.    On: 05/13/2016 02:17    Procedures Procedures (including critical care time)  Medications Ordered in ED Medications - No data to display   Initial Impression / Assessment and Plan / ED Course  I have reviewed the triage vital signs and the nursing notes.  Pertinent labs & imaging results that were available during my care of the patient were reviewed by me and considered in my medical decision making (see chart for details).     Patient with worsening hematuria after having cystoscopy on April 27. Has pain and dysuria and frank blood with urination uncontrolled with Percocet at home.  Vitals are stable, no distress. Hemoglobin is stable. Orthostatics are negative.  Urinalysis shows hematuria without infection. Creatinine is normal. Hemoglobin is stable.  CT scan shows multiple kidney stones but no ureteral stones.  Discussed with Dr. Junious Silk of urology. He suspects hematuria due to recent manipulation and possible stone passage. Should improve on its own. Vitals and kidney function are stable.  [employer] reviewed. Patient received 30 Percocet tablets on April 27.  Patient's pain is improved. Tolerating by mouth. She is well-appearing with normal vital signs. She will follow-up with urology. Return precautions discussed.  Final Clinical Impressions(s) / ED Diagnoses   Final diagnoses:  Gross hematuria    New Prescriptions New Prescriptions   No medications on file  I personally performed the services described in this documentation, which was scribed in my presence. The recorded information has been reviewed and is accurate.    Erin Essex, MD 05/13/16 [phone]

## 2016-05-12 NOTE — ED Triage Notes (Signed)
On Friday had blood vessels burned in bladder to stop bleeding and now having more bleeding, and states bil leg swelling states taking percocet for pain but pain comes back when they wear off.

## 2016-05-13 ENCOUNTER — Encounter (HOSPITAL_COMMUNITY): Payer: Self-pay | Admitting: Urology

## 2016-05-13 ENCOUNTER — Emergency Department (HOSPITAL_COMMUNITY): Payer: 59

## 2016-05-13 DIAGNOSIS — R8271 Bacteriuria: Secondary | ICD-10-CM | POA: Diagnosis not present

## 2016-05-13 DIAGNOSIS — R310 Gross hematuria: Secondary | ICD-10-CM | POA: Diagnosis not present

## 2016-05-13 DIAGNOSIS — R1032 Left lower quadrant pain: Secondary | ICD-10-CM | POA: Diagnosis not present

## 2016-05-13 DIAGNOSIS — R109 Unspecified abdominal pain: Secondary | ICD-10-CM | POA: Diagnosis not present

## 2016-05-13 LAB — CBC WITH DIFFERENTIAL/PLATELET
BASOS PCT: 0 %
Basophils Absolute: 0 10*3/uL (ref 0.0–0.1)
EOS ABS: 0.1 10*3/uL (ref 0.0–0.7)
EOS PCT: 2 %
HCT: 36.6 % (ref 36.0–46.0)
HEMOGLOBIN: 12.3 g/dL (ref 12.0–15.0)
Lymphocytes Relative: 38 %
Lymphs Abs: 2.7 10*3/uL (ref 0.7–4.0)
MCH: 28 pg (ref 26.0–34.0)
MCHC: 33.6 g/dL (ref 30.0–36.0)
MCV: 83.4 fL (ref 78.0–100.0)
MONO ABS: 0.6 10*3/uL (ref 0.1–1.0)
MONOS PCT: 9 %
Neutro Abs: 3.6 10*3/uL (ref 1.7–7.7)
Neutrophils Relative %: 51 %
Platelets: 219 10*3/uL (ref 150–400)
RBC: 4.39 MIL/uL (ref 3.87–5.11)
RDW: 13.1 % (ref 11.5–15.5)
WBC: 7 10*3/uL (ref 4.0–10.5)

## 2016-05-13 LAB — BASIC METABOLIC PANEL
ANION GAP: 7 (ref 5–15)
BUN: 24 mg/dL — ABNORMAL HIGH (ref 6–20)
CHLORIDE: 103 mmol/L (ref 101–111)
CO2: 28 mmol/L (ref 22–32)
CREATININE: 0.81 mg/dL (ref 0.44–1.00)
Calcium: 9.9 mg/dL (ref 8.9–10.3)
GFR calc non Af Amer: >60 mL/min (ref >60)
Glucose, Bld: 91 mg/dL (ref 65–99)
Potassium: 4 mmol/L (ref 3.5–5.1)
SODIUM: 138 mmol/L (ref 135–145)

## 2016-05-13 LAB — URINALYSIS, ROUTINE W REFLEX MICROSCOPIC
Bacteria, UA: NONE SEEN
Bilirubin Urine: NEGATIVE
GLUCOSE, UA: NEGATIVE mg/dL
KETONES UR: NEGATIVE mg/dL
Leukocytes, UA: NEGATIVE
NITRITE: NEGATIVE
PROTEIN: 100 mg/dL — AB
Specific Gravity, Urine: 1.014 (ref 1.005–1.030)
pH: 5 (ref 5.0–8.0)

## 2016-05-13 LAB — PREGNANCY, URINE: Preg Test, Ur: NEGATIVE

## 2016-05-13 MED ORDER — ONDANSETRON HCL 4 MG/2ML IJ SOLN
4.0000 mg | Freq: Once | INTRAMUSCULAR | Status: AC
  Administered 2016-05-13: 4 mg via INTRAVENOUS
  Filled 2016-05-13: qty 2

## 2016-05-13 MED ORDER — FENTANYL CITRATE (PF) 100 MCG/2ML IJ SOLN
50.0000 ug | Freq: Once | INTRAMUSCULAR | Status: AC
  Administered 2016-05-13: 50 ug via INTRAVENOUS
  Filled 2016-05-13: qty 2

## 2016-05-13 MED ORDER — IBUPROFEN 600 MG PO TABS: 600.0000 mg | ORAL_TABLET | Freq: Four times a day (QID) | ORAL | 0 refills | Status: DC | PRN

## 2016-05-13 MED ORDER — ONDANSETRON 4 MG PO TBDP: 4.0000 mg | ORAL_TABLET | Freq: Three times a day (TID) | ORAL | 0 refills | Status: DC | PRN

## 2016-05-13 NOTE — Discharge Instructions (Signed)
Call Dr. Noah Delaine for an appointment tomorrow morning. Take your pain medication as prescribed. Return to the ED if you develop fever, vomiting or worsening pain or other concerns.

## 2016-05-14 LAB — URINE CULTURE: Culture: NO GROWTH

## 2016-05-15 NOTE — Op Note (Signed)
.  Preoperative diagnosis: bilateral renal calculi  Postoperative diagnosis: Same  Procedure: 1 cystoscopy 2. Bilateral retrograde pyelography 3.  Intraoperative fluoroscopy, under one hour, with interpretation 4.  Bilateral ureteroscopic stone manipulation with basket extraction  Attending: Rosie Fate  Anesthesia: General  Estimated blood loss: None  Drains: none  Specimens: stone for analysis  Antibiotics: ancef  Findings: bilateral mid and lower pole renal calculi. No hydronephrosis. Right mid pole AVM. No masses/lesions in the bladder. Ureteral orifices in normal anatomic location.  Indications: Patient is a 41 year old female/female with a history of gross hematuria and bilateral flank pain. After discussing treatment options, they decided proceed with bilateral ureteroscopy..  Procedure her in detail: The patient was brought to the operating room and a brief timeout was done to ensure correct patient, correct procedure, correct site.  General anesthesia was administered patient was placed in dorsal lithotomy position.  Her genitalia was then prepped and draped in usual sterile fashion.  A rigid 102 French cystoscope was passed in the urethra and the bladder.  Bladder was inspected free masses or lesions.  the ureteral orifices were in the normal orthotopic locations. a 6 french ureteral catheter was then instilled into the left ureteral orifice.  a gentle retrograde was obtained and findings noted above. We then advanced a zipwire through the catheter and up to the renal pelvis.  we then removed the cystoscope and cannulated the left ureteral orifice with a semirigid ureteroscope.  We located no stone in the ureter. We then placed a sensor wire up to the renal pelvis. We then used the flexible ureteroscope to perform nephroscopy. We located calculi int he mid and lower poles which were removed with an NGage basket.  We then turned out attention to the right side.  6 french ureteral  catheter was then instilled into the right ureteral orifice.  a gentle retrograde was obtained and findings noted above.  We then advanced a zipwire through the catheter and up to the renal pelvis.  we then removed the cystoscope and cannulated the right ureteral orifice with a semirigid ureteroscope.  We located no stone in the ureter. We then placed a sensor wire up to the renal pelvis. We then used the flexible ureteroscope to perform nephroscopy. We located calculi int he mid and lower poles which were removed with an NGage basket.  the bladder was then drained and this concluded the procedure which was well tolerated by patient.  Complications: None  Condition: Stable, extubated, transferred to PACU  Plan: Patient is to be discharged home as to follow-up in 2 weeks.

## 2016-05-27 DIAGNOSIS — R310 Gross hematuria: Secondary | ICD-10-CM | POA: Diagnosis not present

## 2016-05-27 DIAGNOSIS — N200 Calculus of kidney: Secondary | ICD-10-CM | POA: Diagnosis not present

## 2016-07-15 DIAGNOSIS — N200 Calculus of kidney: Secondary | ICD-10-CM | POA: Diagnosis not present

## 2016-08-08 DIAGNOSIS — H43392 Other vitreous opacities, left eye: Secondary | ICD-10-CM | POA: Diagnosis not present

## 2016-08-08 DIAGNOSIS — N200 Calculus of kidney: Secondary | ICD-10-CM | POA: Diagnosis not present

## 2016-08-08 DIAGNOSIS — H40013 Open angle with borderline findings, low risk, bilateral: Secondary | ICD-10-CM | POA: Diagnosis not present

## 2016-08-22 DIAGNOSIS — L720 Epidermal cyst: Secondary | ICD-10-CM | POA: Diagnosis not present

## 2016-08-22 DIAGNOSIS — D225 Melanocytic nevi of trunk: Secondary | ICD-10-CM | POA: Diagnosis not present

## 2016-08-22 DIAGNOSIS — D224 Melanocytic nevi of scalp and neck: Secondary | ICD-10-CM | POA: Diagnosis not present

## 2016-09-17 ENCOUNTER — Other Ambulatory Visit: Payer: Self-pay | Admitting: Gastroenterology

## 2016-09-17 DIAGNOSIS — R1011 Right upper quadrant pain: Secondary | ICD-10-CM

## 2016-09-17 DIAGNOSIS — K5904 Chronic idiopathic constipation: Secondary | ICD-10-CM | POA: Diagnosis not present

## 2016-09-17 DIAGNOSIS — K219 Gastro-esophageal reflux disease without esophagitis: Secondary | ICD-10-CM | POA: Diagnosis not present

## 2016-09-17 NOTE — Progress Notes (Signed)
Erin Thurmon MD 

## 2016-09-25 ENCOUNTER — Ambulatory Visit (HOSPITAL_COMMUNITY)
Admission: RE | Admit: 2016-09-25 | Discharge: 2016-09-25 | Disposition: A | Discharge status: Home / Self Care | Payer: 59 | Source: Ambulatory Visit | Attending: Gastroenterology | Admitting: Gastroenterology

## 2016-09-25 ENCOUNTER — Encounter (HOSPITAL_COMMUNITY): Payer: Self-pay | Admitting: Radiology

## 2016-09-25 DIAGNOSIS — R1011 Right upper quadrant pain: Secondary | ICD-10-CM | POA: Diagnosis present

## 2016-09-25 MED ORDER — MORPHINE SULFATE (PF) 4 MG/ML IV SOLN
INTRAVENOUS | Status: AC
  Administered 2016-09-25: 2 mg
  Filled 2016-09-25: qty 1

## 2016-09-25 MED ORDER — MORPHINE SULFATE (PF) 2 MG/ML IV SOLN
2.0000 mg | Freq: Once | INTRAVENOUS | Status: AC
  Filled 2016-09-25: qty 1

## 2016-09-25 MED ORDER — TECHNETIUM TC 99M MEBROFENIN IV KIT
5.4000 | PACK | Freq: Once | INTRAVENOUS | Status: AC | PRN
  Administered 2016-09-25: 5.4 via INTRAVENOUS

## 2016-10-01 DIAGNOSIS — Z0000 Encounter for general adult medical examination without abnormal findings: Secondary | ICD-10-CM | POA: Diagnosis not present

## 2016-10-10 DIAGNOSIS — Z0000 Encounter for general adult medical examination without abnormal findings: Secondary | ICD-10-CM | POA: Diagnosis not present

## 2016-10-10 DIAGNOSIS — E559 Vitamin D deficiency, unspecified: Secondary | ICD-10-CM | POA: Diagnosis not present

## 2016-10-15 DIAGNOSIS — N200 Calculus of kidney: Secondary | ICD-10-CM | POA: Diagnosis not present

## 2016-10-16 ENCOUNTER — Other Ambulatory Visit: Payer: Self-pay | Admitting: Surgery

## 2016-10-16 DIAGNOSIS — K811 Chronic cholecystitis: Secondary | ICD-10-CM | POA: Diagnosis not present

## 2016-10-24 DIAGNOSIS — M25561 Pain in right knee: Secondary | ICD-10-CM | POA: Diagnosis not present

## 2016-10-24 DIAGNOSIS — M7062 Trochanteric bursitis, left hip: Secondary | ICD-10-CM | POA: Diagnosis not present

## 2016-10-24 DIAGNOSIS — M25562 Pain in left knee: Secondary | ICD-10-CM | POA: Diagnosis not present

## 2016-11-13 DIAGNOSIS — M25561 Pain in right knee: Secondary | ICD-10-CM | POA: Diagnosis not present

## 2016-11-13 DIAGNOSIS — M25562 Pain in left knee: Secondary | ICD-10-CM | POA: Diagnosis not present

## 2016-12-03 DIAGNOSIS — M25562 Pain in left knee: Secondary | ICD-10-CM | POA: Diagnosis not present

## 2016-12-03 DIAGNOSIS — M25561 Pain in right knee: Secondary | ICD-10-CM | POA: Diagnosis not present

## 2016-12-11 DIAGNOSIS — M25562 Pain in left knee: Secondary | ICD-10-CM | POA: Diagnosis not present

## 2016-12-11 DIAGNOSIS — M25561 Pain in right knee: Secondary | ICD-10-CM | POA: Diagnosis not present

## 2016-12-12 DIAGNOSIS — L409 Psoriasis, unspecified: Secondary | ICD-10-CM | POA: Diagnosis not present

## 2016-12-12 DIAGNOSIS — M25551 Pain in right hip: Secondary | ICD-10-CM | POA: Diagnosis not present

## 2016-12-31 DIAGNOSIS — M25561 Pain in right knee: Secondary | ICD-10-CM | POA: Diagnosis not present

## 2016-12-31 DIAGNOSIS — M25562 Pain in left knee: Secondary | ICD-10-CM | POA: Diagnosis not present

## 2017-01-16 DIAGNOSIS — M25562 Pain in left knee: Secondary | ICD-10-CM | POA: Diagnosis not present

## 2017-01-16 DIAGNOSIS — M25561 Pain in right knee: Secondary | ICD-10-CM | POA: Diagnosis not present

## 2017-01-22 DIAGNOSIS — M25561 Pain in right knee: Secondary | ICD-10-CM | POA: Diagnosis not present

## 2017-01-22 DIAGNOSIS — M25562 Pain in left knee: Secondary | ICD-10-CM | POA: Diagnosis not present

## 2017-01-28 DIAGNOSIS — M25562 Pain in left knee: Secondary | ICD-10-CM | POA: Diagnosis not present

## 2017-01-28 DIAGNOSIS — M25561 Pain in right knee: Secondary | ICD-10-CM | POA: Diagnosis not present

## 2017-02-04 DIAGNOSIS — W57XXXA Bitten or stung by nonvenomous insect and other nonvenomous arthropods, initial encounter: Secondary | ICD-10-CM | POA: Diagnosis not present

## 2017-02-04 DIAGNOSIS — S8012XA Contusion of left lower leg, initial encounter: Secondary | ICD-10-CM | POA: Diagnosis not present

## 2017-02-11 DIAGNOSIS — M25561 Pain in right knee: Secondary | ICD-10-CM | POA: Diagnosis not present

## 2017-02-11 DIAGNOSIS — M25562 Pain in left knee: Secondary | ICD-10-CM | POA: Diagnosis not present

## 2017-02-17 DIAGNOSIS — M25562 Pain in left knee: Secondary | ICD-10-CM | POA: Diagnosis not present

## 2017-02-17 DIAGNOSIS — M25561 Pain in right knee: Secondary | ICD-10-CM | POA: Diagnosis not present

## 2017-02-17 DIAGNOSIS — Z6820 Body mass index (BMI) 20.0-20.9, adult: Secondary | ICD-10-CM | POA: Diagnosis not present

## 2017-02-17 DIAGNOSIS — Z01419 Encounter for gynecological examination (general) (routine) without abnormal findings: Secondary | ICD-10-CM | POA: Diagnosis not present

## 2017-02-25 DIAGNOSIS — M25562 Pain in left knee: Secondary | ICD-10-CM | POA: Diagnosis not present

## 2017-02-25 DIAGNOSIS — M25561 Pain in right knee: Secondary | ICD-10-CM | POA: Diagnosis not present

## 2017-03-04 DIAGNOSIS — M25561 Pain in right knee: Secondary | ICD-10-CM | POA: Diagnosis not present

## 2017-03-04 DIAGNOSIS — M25562 Pain in left knee: Secondary | ICD-10-CM | POA: Diagnosis not present

## 2017-03-10 DIAGNOSIS — M2550 Pain in unspecified joint: Secondary | ICD-10-CM | POA: Diagnosis not present

## 2017-03-10 DIAGNOSIS — L409 Psoriasis, unspecified: Secondary | ICD-10-CM | POA: Diagnosis not present

## 2017-03-10 DIAGNOSIS — M7062 Trochanteric bursitis, left hip: Secondary | ICD-10-CM | POA: Diagnosis not present

## 2017-03-20 DIAGNOSIS — K219 Gastro-esophageal reflux disease without esophagitis: Secondary | ICD-10-CM | POA: Diagnosis not present

## 2017-03-20 DIAGNOSIS — R140 Abdominal distension (gaseous): Secondary | ICD-10-CM | POA: Diagnosis not present

## 2017-03-20 DIAGNOSIS — K5904 Chronic idiopathic constipation: Secondary | ICD-10-CM | POA: Diagnosis not present

## 2017-03-21 ENCOUNTER — Other Ambulatory Visit: Payer: Self-pay | Admitting: Gastroenterology

## 2017-03-21 DIAGNOSIS — R1011 Right upper quadrant pain: Secondary | ICD-10-CM

## 2017-04-01 ENCOUNTER — Ambulatory Visit (HOSPITAL_COMMUNITY)
Admission: RE | Admit: 2017-04-01 | Discharge: 2017-04-01 | Disposition: A | Discharge status: Home / Self Care | Payer: 59 | Source: Ambulatory Visit | Attending: Gastroenterology | Admitting: Gastroenterology

## 2017-04-01 DIAGNOSIS — R112 Nausea with vomiting, unspecified: Secondary | ICD-10-CM | POA: Diagnosis not present

## 2017-04-01 DIAGNOSIS — R1011 Right upper quadrant pain: Secondary | ICD-10-CM | POA: Diagnosis present

## 2017-04-01 MED ORDER — TECHNETIUM TC 99M MEBROFENIN IV KIT
5.0000 | PACK | Freq: Once | INTRAVENOUS | Status: AC | PRN
  Administered 2017-04-01: 5 via INTRAVENOUS

## 2017-04-03 DIAGNOSIS — K5904 Chronic idiopathic constipation: Secondary | ICD-10-CM | POA: Diagnosis not present

## 2017-04-03 DIAGNOSIS — R1031 Right lower quadrant pain: Secondary | ICD-10-CM | POA: Diagnosis not present

## 2017-04-03 DIAGNOSIS — K219 Gastro-esophageal reflux disease without esophagitis: Secondary | ICD-10-CM | POA: Diagnosis not present

## 2017-04-08 DIAGNOSIS — M25561 Pain in right knee: Secondary | ICD-10-CM | POA: Diagnosis not present

## 2017-04-08 DIAGNOSIS — M25562 Pain in left knee: Secondary | ICD-10-CM | POA: Diagnosis not present

## 2017-04-10 DIAGNOSIS — M25562 Pain in left knee: Secondary | ICD-10-CM | POA: Diagnosis not present

## 2017-04-10 DIAGNOSIS — M25561 Pain in right knee: Secondary | ICD-10-CM | POA: Diagnosis not present

## 2017-04-15 ENCOUNTER — Telehealth: Payer: Self-pay | Admitting: Neurology

## 2017-04-15 NOTE — Telephone Encounter (Signed)
Sounds a good idea. Thanks, Myriam Jacobson.   Rosalin Hawking, MD PhD Stroke Neurology 04/15/2017 5:25 PM

## 2017-04-15 NOTE — Telephone Encounter (Signed)
That's totally fine, you can schedule her with me thanks

## 2017-04-15 NOTE — Telephone Encounter (Signed)
MS Ledger, is currently a pt of Dr Erlinda Hong and she has followed him for headaches. The pt received the letter in the mail and she is wanting to change over her care with Dr Jaynee Eagles. The patient is the daughter of Donzetta Starch, who is a patient of Dr Jaynee Eagles and she had personally spoke with Dr Jaynee Eagles about having her follow her for her headaches. I have informed the patient that I would send a note to Dr Erlinda Hong, Dr Hale Bogus nurse and that we would call back to schedule once we have gotten the approval. Pt verbalized understanding.

## 2017-04-16 NOTE — Telephone Encounter (Signed)
Spoke with the patient and scheduled her for Thursday 05/29/17 @ 08:30 AM arrival time 08:00 AM. The patient verbalized appreciation. She will call back today if there is any schedule conflict and is aware that r/s should be greater than 24 hrs of appt time.

## 2017-04-21 DIAGNOSIS — M25561 Pain in right knee: Secondary | ICD-10-CM | POA: Diagnosis not present

## 2017-04-21 DIAGNOSIS — M25562 Pain in left knee: Secondary | ICD-10-CM | POA: Diagnosis not present

## 2017-05-07 DIAGNOSIS — M25561 Pain in right knee: Secondary | ICD-10-CM | POA: Diagnosis not present

## 2017-05-07 DIAGNOSIS — M25562 Pain in left knee: Secondary | ICD-10-CM | POA: Diagnosis not present

## 2017-05-29 ENCOUNTER — Ambulatory Visit: Payer: Self-pay | Admitting: Neurology

## 2017-05-29 ENCOUNTER — Encounter: Payer: Self-pay | Admitting: Neurology

## 2017-05-29 ENCOUNTER — Telehealth: Payer: Self-pay | Admitting: *Deleted

## 2017-05-29 NOTE — Telephone Encounter (Signed)
Pt no showed f/u appt on 05/29/2017 @ 08:30.

## 2017-05-29 NOTE — Telephone Encounter (Signed)
Patient had an appt at 8:30 this morning.  She arrived at 8:41 and per MD had to reschedule.  We rescheduled to the first available on July 31.  Patient states she needs to be seen sooner.  Requested a call back.

## 2017-05-30 NOTE — Telephone Encounter (Signed)
Please place her in a work-in spot or a botox spot sometime in middle to late June please

## 2017-06-02 NOTE — Telephone Encounter (Signed)
Called pt and scheduled her for June 10th @ 1:00 pm arrival time 12:30. She verbalized appreciation. She will call back asap if any scheduling conflict arises.

## 2017-06-23 ENCOUNTER — Encounter: Payer: Self-pay | Admitting: Neurology

## 2017-06-23 ENCOUNTER — Ambulatory Visit: Payer: 59 | Admitting: Neurology

## 2017-06-23 VITALS — BP 108/63 | HR 89 | Ht 62.5 in | Wt 120.0 lb

## 2017-06-23 DIAGNOSIS — M5481 Occipital neuralgia: Secondary | ICD-10-CM

## 2017-06-23 DIAGNOSIS — H532 Diplopia: Secondary | ICD-10-CM | POA: Diagnosis not present

## 2017-06-23 DIAGNOSIS — R51 Headache with orthostatic component, not elsewhere classified: Secondary | ICD-10-CM

## 2017-06-23 DIAGNOSIS — G43909 Migraine, unspecified, not intractable, without status migrainosus: Secondary | ICD-10-CM | POA: Insufficient documentation

## 2017-06-23 DIAGNOSIS — H539 Unspecified visual disturbance: Secondary | ICD-10-CM

## 2017-06-23 DIAGNOSIS — G43009 Migraine without aura, not intractable, without status migrainosus: Secondary | ICD-10-CM | POA: Diagnosis not present

## 2017-06-23 DIAGNOSIS — R519 Headache, unspecified: Secondary | ICD-10-CM

## 2017-06-23 DIAGNOSIS — M7918 Myalgia, other site: Secondary | ICD-10-CM

## 2017-06-23 MED ORDER — METHYLPREDNISOLONE 4 MG PO TBPK
ORAL_TABLET | ORAL | 1 refills | Status: DC
Start: 2017-06-23 — End: 2019-02-05

## 2017-06-23 NOTE — Patient Instructions (Addendum)
Occipital Nerve Block: Dr Marcelle Smiling   Occipital Neuralgia Occipital neuralgia is a type of headache that causes episodes of very bad pain in the back of your head. Pain from occipital neuralgia may spread (radiate) to other parts of your head. The pain is usually brief and often goes away after you rest and relax. These headaches may be caused by irritation of the nerves that leave your spinal cord high up in your neck, just below the base of your skull (occipital nerves). Your occipital nerves transmit sensations from the back of your head, the top of your head, and the areas behind your ears. What are the causes? Occipital neuralgia can occur without any known cause (primary headache syndrome). In other cases, occipital neuralgia is caused by pressure on or irritation of one of the two occipital nerves. Causes of occipital nerve compression or irritation include:  Wear and tear of the vertebrae in the neck (osteoarthritis).  Neck injury.  Disease of the disks that separate the vertebrae.  Tumors.  Gout.  Infections.  Diabetes.  Swollen blood vessels that put pressure on the occipital nerves.  Muscle spasm in the neck.  What are the signs or symptoms? Pain is the main symptom of occipital neuralgia. It usually starts in the back of the head but may also be felt in other areas supplied by the occipital nerves. Pain is usually on one side but may be on both sides. You may have:  Brief episodes of very bad pain that is burning, stabbing, shocking, or shooting.  Pain behind the eye.  Pain triggered by neck movement or hair brushing.  Scalp tenderness.  Aching in the back of the head between episodes of very bad pain.  How is this diagnosed? Your health care provider may diagnose occipital neuralgia based on your symptoms and a physical exam. During the exam, the health care provider may push on areas supplied by the occipital nerves to see if they are painful. Some tests  may also be done to help in making the diagnosis. These may include:  Imaging studies of the upper spinal cord, such as an MRI or CT scan. These may show compression or spinal cord abnormalities.  Nerve block. You will get an injection of numbing medicine (local anesthetic) near the occipital nerve to see if this relieves pain.  How is this treated? Treatment may begin with simple measures, such as:  Rest.  Massage.  Heat.  Over-the-counter pain relievers.  If these measures do not work, you may need other treatments, including:  Medicines such as: ? Prescription-strength anti-inflammatory medicines. ? Muscle relaxants. ? Antiseizure medicines. ? Antidepressants.  Steroid injection. This involves injections of local anesthetic and strong anti-inflammatory drugs (steroids).  Pulsed radiofrequency. Wires are implanted to deliver electrical impulses that block pain signals from the occipital nerve.  Physical therapy.  Surgery to relieve nerve pressure.  Follow these instructions at home:  Take all medicines as directed by your health care provider.  Avoid activities that cause pain.  Rest when you have an attack of pain.  Try gentle massage or a heating pad to relieve pain.  Work with a physical therapist to learn stretching exercises you can do at home.  Try a different pillow or sleeping position.  Practice good posture.  Try to stay active. Get regular exercise that does not cause pain. Ask your health care provider to suggest safe exercises for you.  Keep all follow-up visits as directed by your health care provider. This  is important. Contact a health care provider if:  Your medicine is not working.  You have new or worsening symptoms. Get help right away if:  You have very bad head pain that is not going away.  You have a sudden change in vision, balance, or speech. This information is not intended to replace advice given to you by your health care  provider. Make sure you discuss any questions you have with your health care provider. Document Released: 12/25/2000 Document Revised: 06/08/2015 Document Reviewed: 12/23/2012 Elsevier Interactive Patient Education  2017 [employer].

## 2017-06-23 NOTE — Progress Notes (Signed)
[facility]    Provider:  Dr Jaynee Eagles Referring Provider: Leighton Ruff, MD Primary Care Physician:  Leighton Ruff, MD  CC:  headache  HPI:  Erin Costa is a 42 y.o. female here as a referral from Dr. Drema Dallas for headache.  Past medical history of headache left hip pain and right shoulder pain who was seen by my colleague Jindong shoe in February 2018.  At that time she had lateral tongue numbness bilateral hand and foot tingling and headache long-standing mild headache.  The headache was more frontal and temporal or at the top of the head.  Allergy medication would help.  At that time the headache was more at the back of the head throbbing coming and going sometimes lasting days.  Also complained of insomnia.  Left foot fasciitis.  B12 was normal but vitamin D was low.  Headaches started a few months ago. Shooting pain in the back of the head like electricity, severe, happening all day, no inciting events no head trauma, she has chronic neck pain, occipital headache. She went to physical therapy for her neck without relief but not recently, tightness, throbbing, she has reversal or lordosis. Haven't had the pains in a few days. She has a hx of migraines, improved. The occipital headache did not have migrainous qualities. The occipital pain never happened before, no other associated symptoms except neck pain which is chronic, starts gradually then worsens and intensifies. Worse supine an din the morning. She is having vision changes, blurry vision and diplopia.   Reviewed notes, labs and imaging from outside physicians, which showed:  meds tried: zofran. Diclofenac.    Reviewed notes: At that time she had lateral tongue numbness bilateral hand and foot tingling and headache long-standing mild headache.  The headache was more frontal and temporal or at the top of the head.  Allergy medication would help.  At that time the headache was more at the back of the head throbbing  coming and going sometimes lasting days.  Also complained of insomnia.  Left foot fasciitis.  B12 was normal but vitamin D was low.  Neurologic exam was normal.  His diagnosis was more likely stress, anxiety related, tension headache and no neuro imaging was ordered.  He recommended vitamin D and trazodone.    Review of Systems: Patient complains of symptoms per HPI as well as the following symptoms Occipital head pain. Pertinent negatives and positives per HPI. All others negative.   Social History   Socioeconomic History  . Marital status: Married    Spouse name: Not on file  . Number of children: 2  . Years of education: Not on file  . Highest education level: Not on file  Occupational History  . Not on file  Social Needs  . Financial resource strain: Not on file  . Food insecurity:    Worry: Not on file    Inability: Not on file  . Transportation needs:    Medical: Not on file    Non-medical: Not on file  Tobacco Use  . Smoking status: Never Smoker  . Smokeless tobacco: Never Used  Substance and Sexual Activity  . Alcohol use: Yes    Alcohol/week: 0.6 oz    Types: 1 Glasses of wine per week    Comment: wine occasionally  . Drug use: No  . Sexual activity: Not on file  Lifestyle  . Physical activity:    Days per week: Not on file    Minutes per session: Not  on file  . Stress: Not on file  Relationships  . Social connections:    Talks on phone: Not on file    Gets together: Not on file    Attends religious service: Not on file    Active member of club or organization: Not on file    Attends meetings of clubs or organizations: Not on file    Relationship status: Not on file  . Intimate partner violence:    Fear of current or ex partner: Not on file    Emotionally abused: Not on file    Physically abused: Not on file    Forced sexual activity: Not on file  Other Topics Concern  . Not on file  Social History Narrative   Lives at home with husband and 2 children     Right handed   Caffeine: 1 cup coffee daily    Family History  Problem Relation Age of Onset  . Stroke Mother   . Dementia Maternal Aunt   . Stroke Maternal Grandmother     Past Medical History:  Diagnosis Date  . Anemia    hx of   . Back pain   . Bursitis    to left hip  . GERD (gastroesophageal reflux disease)   . Headache   . Heart murmur   . Hypoglycemia   . Hypoglycemia   . Kidney stones    calcium oxalate and one other kind  . Palpitations   . Vitamin D deficiency     Past Surgical History:  Procedure Laterality Date  . CYSTOSCOPY WITH RETROGRADE PYELOGRAM, URETEROSCOPY AND STENT PLACEMENT Bilateral 05/10/2016   Procedure: CYSTOSCOPY WITH RETROGRADE PYELOGRAM, URETEROSCOPY, BASKET EXTRACTION OF STONE ON LEFT;  Surgeon: Cleon Gustin, MD;  Location: [facility];  Service: Urology;  Laterality: Bilateral;  . UTERINE FIBROID SURGERY      Current Outpatient Medications  Medication Sig Dispense Refill  . cetirizine (ZYRTEC) 10 MG tablet Take 10 mg by mouth daily.    Marland Kitchen ibuprofen (ADVIL,MOTRIN) 600 MG tablet Take 1 tablet (600 mg total) by mouth every 6 (six) hours as needed. 30 tablet 0  . levonorgestrel (MIRENA) 20 MCG/24HR IUD 1 each by Intrauterine route once.    . Melatonin 3 MG TABS Take 6 mg by mouth at bedtime as needed.    . ranitidine (ZANTAC) 300 MG tablet Take 300 mg by mouth 2 (two) times daily.  12  . Rhodiola rosea (RHODIOLA PO) Take by mouth daily. Uses 2 droppers in water    . tacrolimus (PROTOPIC) 0.1 % ointment Apply 1 application topically daily.     . methylPREDNISolone (MEDROL DOSEPAK) 4 MG TBPK tablet follow package directions 21 tablet 1   No current facility-administered medications for this visit.     Allergies as of 06/23/2017 - Review Complete 06/23/2017  Allergen Reaction Noted  . Dovonex [calcipotriene]  02/21/2016  . Other  10/05/2014    Vitals: BP 108/63 (BP Location: Right Arm, Patient Position: Sitting)   Pulse 89   Ht 5'  2.5" (1.588 m)   Wt 120 lb (54.4 kg)   BMI 21.60 kg/m  Last Weight:  Wt Readings from Last 1 Encounters:  06/23/17 120 lb (54.4 kg)   Last Height:   Ht Readings from Last 1 Encounters:  06/23/17 5' 2.5" (1.588 m)   Physical exam: Exam: Gen: NAD, conversant, well nourised, well groomed  CV: RRR, no MRG. No Carotid Bruits. No peripheral edema, warm, nontender Eyes: Conjunctivae clear without exudates or hemorrhage  Neuro: Detailed Neurologic Exam  Speech:    Speech is normal; fluent and spontaneous with normal comprehension.  Cognition:    The patient is oriented to person, place, and time;     recent and remote memory intact;     language fluent;     normal attention, concentration,     fund of knowledge Cranial Nerves:    The pupils are equal, round, and reactive to light. The fundi are normal and spontaneous venous pulsations are present. Visual fields are full to finger confrontation. Extraocular movements are intact. Trigeminal sensation is intact and the muscles of mastication are normal. The face is symmetric. The palate elevates in the midline. Hearing intact. Voice is normal. Shoulder shrug is normal. The tongue has normal motion without fasciculations.   Coordination:    Normal finger to nose and heel to shin. Normal rapid alternating movements.   Gait:    Heel-toe and tandem gait are normal.   Motor Observation:    No asymmetry, no atrophy, and no involuntary movements noted. Tone:    Normal muscle tone.    Posture:    Posture is normal. normal erect    Strength:    Strength is V/V in the upper and lower limbs.      Sensation: intact to LT     Reflex Exam:  DTR's:    Deep tendon reflexes in the upper and lower extremities are normal bilaterally.   Toes:    The toes are downgoing bilaterally.   Clonus:    Clonus is absent.       Assessment/Plan:  42 year old with occipital headaches, worse positionally. 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 Rip Harbour at ortho will order Steroid taper if symptoms start again and call for nerve blocks, Megan or Dr. Felecia Shelling can also do them. Talk to [facility].   MRI of the brain due to concerning symptoms of occipital position headache, vision changes to assess for space-occupying mass, chiari, intracranial HTN or other etiology.   Orders Placed This Encounter  Procedures  . MR BRAIN W WO CONTRAST  . Comprehensive metabolic panel  . CBC  . TSH  . Ambulatory referral to Physical Therapy   Meds ordered this encounter  Medications  . methylPREDNISolone (MEDROL DOSEPAK) 4 MG TBPK tablet    Sig: follow package directions    Dispense:  21 tablet    Refill:  [place], MD  [facility] [address] [place], [place] [address]  Phone [phone] Fax [phone]

## 2017-06-24 ENCOUNTER — Telehealth: Payer: Self-pay | Admitting: *Deleted

## 2017-06-24 ENCOUNTER — Ambulatory Visit: Payer: 59

## 2017-06-24 ENCOUNTER — Telehealth: Payer: Self-pay | Admitting: Neurology

## 2017-06-24 DIAGNOSIS — R51 Headache with orthostatic component, not elsewhere classified: Secondary | ICD-10-CM

## 2017-06-24 DIAGNOSIS — H532 Diplopia: Secondary | ICD-10-CM

## 2017-06-24 DIAGNOSIS — H539 Unspecified visual disturbance: Secondary | ICD-10-CM | POA: Diagnosis not present

## 2017-06-24 DIAGNOSIS — R519 Headache, unspecified: Secondary | ICD-10-CM

## 2017-06-24 LAB — CBC
Hematocrit: 36.8 % (ref 34.0–46.6)
Hemoglobin: 12.3 g/dL (ref 11.1–15.9)
MCH: 27.3 pg (ref 26.6–33.0)
MCHC: 33.4 g/dL (ref 31.5–35.7)
MCV: 82 fL (ref 79–97)
PLATELETS: 270 10*3/uL (ref 150–450)
RBC: 4.5 x10E6/uL (ref 3.77–5.28)
RDW: 13.7 % (ref 12.3–15.4)
WBC: 5.3 10*3/uL (ref 3.4–10.8)

## 2017-06-24 LAB — COMPREHENSIVE METABOLIC PANEL
ALK PHOS: 50 IU/L (ref 39–117)
ALT: 13 IU/L (ref 0–32)
AST: 19 IU/L (ref 0–40)
Albumin/Globulin Ratio: 1.6 (ref 1.2–2.2)
Albumin: 4.5 g/dL (ref 3.5–5.5)
BILIRUBIN TOTAL: 0.4 mg/dL (ref 0.0–1.2)
BUN/Creatinine Ratio: 25 — ABNORMAL HIGH (ref 9–23)
BUN: 17 mg/dL (ref 6–24)
CHLORIDE: 105 mmol/L (ref 96–106)
CO2: 22 mmol/L (ref 20–29)
CREATININE: 0.69 mg/dL (ref 0.57–1.00)
Calcium: 9.4 mg/dL (ref 8.7–10.2)
GFR calc Af Amer: 123 mL/min/{1.73_m2} (ref >59)
GFR calc non Af Amer: 107 mL/min/{1.73_m2} (ref >59)
GLUCOSE: 57 mg/dL — AB (ref 65–99)
Globulin, Total: 2.8 g/dL (ref 1.5–4.5)
Potassium: 4.3 mmol/L (ref 3.5–5.2)
SODIUM: 141 mmol/L (ref 134–144)
Total Protein: 7.3 g/dL (ref 6.0–8.5)

## 2017-06-24 LAB — TSH: TSH: 0.768 u[IU]/mL (ref 0.450–4.500)

## 2017-06-24 MED ORDER — GADOPENTETATE DIMEGLUMINE 469.01 MG/ML IV SOLN
11.0000 mL | Freq: Once | INTRAVENOUS | Status: AC | PRN
  Administered 2017-06-24: 11 mL via INTRAVENOUS

## 2017-06-24 NOTE — Telephone Encounter (Signed)
MR Brain w/wo contrast Dr. Jaynee Eagles [facility] Auth: [id number] (exp. 06/24/17 to 08/08/17). Patient is scheduled at [facility] for 06/24/17.

## 2017-06-24 NOTE — Telephone Encounter (Signed)
Spoke with patient and informed her  That her lab results are unremarkable. Advised Dr Jaynee Eagles stated she appears a little dehydrated, and she may consider increasing fluids especially water. She verbalized understanding, stated she had her MRI today. This RN advised she'll get a call in a few days with that result. She verbalized understanding, appreciation of call.

## 2017-06-27 ENCOUNTER — Telehealth: Payer: Self-pay | Admitting: Neurology

## 2017-06-27 NOTE — Telephone Encounter (Signed)
Called the patient and made her aware of the MRI results. Informed her they were normal but she had some sinusitis noted but appeared to be chronic. Pt verbalized understanding. Pt had no questions at this time but was encouraged to call back if questions arise.

## 2017-06-27 NOTE — Telephone Encounter (Signed)
-----   Message from Melvenia Beam, MD sent at 06/27/2017 10:21 AM EDT ----- MRI normal. Some incidental sinusitis chronic seen.

## 2017-07-15 DIAGNOSIS — M5481 Occipital neuralgia: Secondary | ICD-10-CM | POA: Diagnosis not present

## 2017-07-15 DIAGNOSIS — M542 Cervicalgia: Secondary | ICD-10-CM | POA: Diagnosis not present

## 2017-07-16 DIAGNOSIS — L723 Sebaceous cyst: Secondary | ICD-10-CM | POA: Diagnosis not present

## 2017-07-16 DIAGNOSIS — L2089 Other atopic dermatitis: Secondary | ICD-10-CM | POA: Diagnosis not present

## 2017-07-16 DIAGNOSIS — L218 Other seborrheic dermatitis: Secondary | ICD-10-CM | POA: Diagnosis not present

## 2017-07-24 DIAGNOSIS — M5481 Occipital neuralgia: Secondary | ICD-10-CM | POA: Diagnosis not present

## 2017-07-24 DIAGNOSIS — M542 Cervicalgia: Secondary | ICD-10-CM | POA: Diagnosis not present

## 2017-07-28 DIAGNOSIS — M5481 Occipital neuralgia: Secondary | ICD-10-CM | POA: Diagnosis not present

## 2017-07-28 DIAGNOSIS — M542 Cervicalgia: Secondary | ICD-10-CM | POA: Diagnosis not present

## 2017-08-13 ENCOUNTER — Ambulatory Visit: Payer: 59 | Admitting: Neurology

## 2017-08-13 ENCOUNTER — Telehealth: Payer: Self-pay | Admitting: Neurology

## 2017-08-13 ENCOUNTER — Encounter: Payer: Self-pay | Admitting: Neurology

## 2017-08-13 VITALS — BP 128/74 | HR 81 | Ht 62.5 in | Wt 121.0 lb

## 2017-08-13 DIAGNOSIS — R29898 Other symptoms and signs involving the musculoskeletal system: Secondary | ICD-10-CM | POA: Diagnosis not present

## 2017-08-13 DIAGNOSIS — M5412 Radiculopathy, cervical region: Secondary | ICD-10-CM | POA: Diagnosis not present

## 2017-08-13 DIAGNOSIS — M5410 Radiculopathy, site unspecified: Secondary | ICD-10-CM

## 2017-08-13 DIAGNOSIS — R339 Retention of urine, unspecified: Secondary | ICD-10-CM | POA: Diagnosis not present

## 2017-08-13 DIAGNOSIS — M5416 Radiculopathy, lumbar region: Secondary | ICD-10-CM | POA: Diagnosis not present

## 2017-08-13 NOTE — Patient Instructions (Signed)
MRI of the cervical and lumbar spine    Lumbosacral Radiculopathy Lumbosacral radiculopathy is a condition that involves the spinal nerves and nerve roots in the low back and bottom of the spine. The condition develops when these nerves and nerve roots move out of place or become inflamed and cause symptoms. What are the causes? This condition may be caused by:  Pressure from a disk that bulges out of place (herniated disk). A disk is a plate of cartilage that separates bones in the spine.  Disk degeneration.  A narrowing of the bones of the lower back (spinal stenosis).  A tumor.  An infection.  An injury that places sudden pressure on the disks that cushion the bones of your lower spine.  What increases the risk? This condition is more likely to develop in:  Males aged 30-50 years.  Females aged 68-60 years.  People who lift improperly.  People who are overweight or live a sedentary lifestyle.  People who smoke.  People who perform repetitive activities that strain the spine.  What are the signs or symptoms? Symptoms of this condition include:  Pain that goes down from the back into the legs (sciatica). This is the most common symptom. The pain may be worse with sitting, coughing, or sneezing.  Pain and numbness in the arms and legs.  Muscle weakness.  Tingling.  Loss of bladder control or bowel control.  How is this diagnosed? This condition is diagnosed with a physical exam and medical history. If the pain is lasting, you may have tests, such as:  MRI scan.  X-ray.  CT scan.  Myelogram.  Nerve conduction study.  How is this treated? This condition is often treated with:  Hot packs and ice applied to affected areas.  Stretches to improve flexibility.  Exercises to strengthen back muscles.  Physical therapy.  Pain medicine.  A steroid injection in the spine.  In some cases, no treatment is needed. If the condition is long-lasting  (chronic), or if symptoms are severe, treatment may involve surgery or lifestyle changes, such as following a weight loss plan. Follow these instructions at home: Medicines  Take medicines only as directed by your health care provider.  Do not drive or operate heavy machinery while taking pain medicine. Injury care  Apply a heat pack to the injured area as directed by your health care provider.  Apply ice to the affected area: ? Put ice in a plastic bag. ? Place a towel between your skin and the bag. ? Leave the ice on for 20-30 minutes, every 2 hours while you are awake or as needed. Or, leave the ice on for as long as directed by your health care provider. Other Instructions  If you were shown how to do any exercises or stretches, do them as directed by your health care provider.  If your health care provider prescribed a diet or exercise program, follow it as directed.  Keep all follow-up visits as directed by your health care provider. This is important. Contact a health care provider if:  Your pain does not improve over time even when taking pain medicines. Get help right away if:  Your develop severe pain.  Your pain suddenly gets worse.  You develop increasing weakness in your legs.  You lose the ability to control your bladder or bowel.  You have difficulty walking or balancing.  You have a fever. This information is not intended to replace advice given to you by your health care provider.  Make sure you discuss any questions you have with your health care provider. Document Released: 12/31/2004 Document Revised: 06/08/2015 Document Reviewed: 12/27/2013 Elsevier Interactive Patient Education  2018 [employer]. Cervical Radiculopathy Cervical radiculopathy means that a nerve in the neck is pinched or bruised. This can cause pain or loss of feeling (numbness) that runs from your neck to your arm and fingers. Follow these instructions at home: Managing pain  Take  over-the-counter and prescription medicines only as told by your doctor.  If directed, put ice on the injured or painful area. ? Put ice in a plastic bag. ? Place a towel between your skin and the bag. ? Leave the ice on for 20 minutes, 2-3 times per day.  If ice does not help, you can try using heat. Take a warm shower or warm bath, or use a heat pack as told by your doctor.  You may try a gentle neck and shoulder massage. Activity  Rest as needed. Follow instructions from your doctor about any activities to avoid.  Do exercises as told by your doctor or physical therapist. General instructions  If you were given a soft collar, wear it as told by your doctor.  Use a flat pillow when you sleep.  Keep all follow-up visits as told by your doctor. This is important. Contact a doctor if:  Your condition does not improve with treatment. Get help right away if:  Your pain gets worse and is not controlled with medicine.  You lose feeling or feel weak in your hand, arm, face, or leg.  You have a fever.  You have a stiff neck.  You cannot control when you poop or pee (have incontinence).  You have trouble with walking, balance, or talking. This information is not intended to replace advice given to you by your health care provider. Make sure you discuss any questions you have with your health care provider. Document Released: 12/20/2010 Document Revised: 06/08/2015 Document Reviewed: 02/24/2014 Elsevier Interactive Patient Education  [employer].

## 2017-08-13 NOTE — Telephone Encounter (Signed)
[place] authorization [id number] and [id number] (09/27/17). Called patient X1. DW
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
## 2017-08-13 NOTE — Telephone Encounter (Signed)
Pt returning Danielle's call  °

## 2017-08-14 ENCOUNTER — Other Ambulatory Visit: Payer: Self-pay | Admitting: Gastroenterology

## 2017-08-14 DIAGNOSIS — M5481 Occipital neuralgia: Secondary | ICD-10-CM | POA: Diagnosis not present

## 2017-08-14 DIAGNOSIS — R1031 Right lower quadrant pain: Secondary | ICD-10-CM | POA: Diagnosis not present

## 2017-08-14 DIAGNOSIS — K219 Gastro-esophageal reflux disease without esophagitis: Secondary | ICD-10-CM | POA: Diagnosis not present

## 2017-08-14 DIAGNOSIS — M542 Cervicalgia: Secondary | ICD-10-CM | POA: Diagnosis not present

## 2017-08-14 DIAGNOSIS — K5900 Constipation, unspecified: Secondary | ICD-10-CM | POA: Diagnosis not present

## 2017-08-14 NOTE — Telephone Encounter (Signed)
Patient called back and we scheduled imaging. DW

## 2017-08-19 DIAGNOSIS — M5481 Occipital neuralgia: Secondary | ICD-10-CM | POA: Diagnosis not present

## 2017-08-19 DIAGNOSIS — M542 Cervicalgia: Secondary | ICD-10-CM | POA: Diagnosis not present

## 2017-08-27 ENCOUNTER — Ambulatory Visit (INDEPENDENT_AMBULATORY_CARE_PROVIDER_SITE_OTHER): Payer: 59

## 2017-08-27 DIAGNOSIS — M5416 Radiculopathy, lumbar region: Secondary | ICD-10-CM | POA: Diagnosis not present

## 2017-08-27 DIAGNOSIS — M5412 Radiculopathy, cervical region: Secondary | ICD-10-CM

## 2017-08-27 DIAGNOSIS — R339 Retention of urine, unspecified: Secondary | ICD-10-CM

## 2017-08-27 DIAGNOSIS — R29898 Other symptoms and signs involving the musculoskeletal system: Secondary | ICD-10-CM | POA: Diagnosis not present

## 2017-08-28 ENCOUNTER — Other Ambulatory Visit: Payer: 59

## 2017-09-01 ENCOUNTER — Encounter: Payer: Self-pay | Admitting: *Deleted

## 2017-09-01 ENCOUNTER — Telehealth: Payer: Self-pay | Admitting: *Deleted

## 2017-09-01 NOTE — Telephone Encounter (Signed)
Called pt and informed her that the MRI c-spine & lumbar spine showed arthritic and degenerative changes but nothing significant. Her questions were answered. She would like to know what this all means. She was reminded that if she has another flare up she can call for nerve blocks. Pt also said she had a steroid prn. RN advised she review with Dr. Jaynee Eagles and call pt back. She verbalized appreciation.

## 2017-09-01 NOTE — Telephone Encounter (Signed)
-----   Message from Melvenia Beam, MD sent at 09/01/2017  9:24 AM EDT ----- MRI of the lumbar spine and cervical spine show degenerative/arthritic changes but nothing significant thanks

## 2017-09-01 NOTE — Telephone Encounter (Signed)
Called pt and discussed all of Dr. Cathren Laine suggestions. Pt will consider these and will pickup a copy of the MRI results to hold onto to review with PT and her other doctors. Pt also asked for a letter so she could have the suggestions in writing. I have written this and placed it up front for pt pickup. Pt aware that she will need to sign a release form before she can get her MRI results. Pt appreciative and will come to office within next 2 days.

## 2017-09-01 NOTE — Telephone Encounter (Signed)
The other option is sending to Dr. Lucia Gaskins at [place] for something called medial branch block injections where they inject into the cervical spine which can help with neck and occipital neuralgia/pain. Her orthopaedist may also do these if she would like to ask. We could start medication as well maybe some Gabapentin or Lyrica is symptoms worsen thanks

## 2017-09-03 DIAGNOSIS — H04123 Dry eye syndrome of bilateral lacrimal glands: Secondary | ICD-10-CM | POA: Diagnosis not present

## 2017-09-03 DIAGNOSIS — H01021 Squamous blepharitis right upper eyelid: Secondary | ICD-10-CM | POA: Diagnosis not present

## 2017-09-03 DIAGNOSIS — H40013 Open angle with borderline findings, low risk, bilateral: Secondary | ICD-10-CM | POA: Diagnosis not present

## 2017-09-04 ENCOUNTER — Ambulatory Visit
Admission: RE | Admit: 2017-09-04 | Discharge: 2017-09-04 | Disposition: A | Discharge status: Home / Self Care | Payer: 59 | Source: Ambulatory Visit | Attending: Gastroenterology | Admitting: Gastroenterology

## 2017-09-04 DIAGNOSIS — K5900 Constipation, unspecified: Secondary | ICD-10-CM | POA: Diagnosis not present

## 2017-09-04 DIAGNOSIS — N200 Calculus of kidney: Secondary | ICD-10-CM | POA: Diagnosis not present

## 2017-09-04 DIAGNOSIS — M542 Cervicalgia: Secondary | ICD-10-CM | POA: Diagnosis not present

## 2017-09-04 DIAGNOSIS — M5481 Occipital neuralgia: Secondary | ICD-10-CM | POA: Diagnosis not present

## 2017-09-04 DIAGNOSIS — R1031 Right lower quadrant pain: Secondary | ICD-10-CM

## 2017-09-04 MED ORDER — IOPAMIDOL (ISOVUE-300) INJECTION 61%
100.0000 mL | Freq: Once | INTRAVENOUS | Status: AC | PRN
  Administered 2017-09-04: 100 mL via INTRAVENOUS

## 2017-09-11 DIAGNOSIS — M542 Cervicalgia: Secondary | ICD-10-CM | POA: Diagnosis not present

## 2017-09-11 DIAGNOSIS — M5481 Occipital neuralgia: Secondary | ICD-10-CM | POA: Diagnosis not present

## 2017-09-25 DIAGNOSIS — M5481 Occipital neuralgia: Secondary | ICD-10-CM | POA: Diagnosis not present

## 2017-09-25 DIAGNOSIS — M542 Cervicalgia: Secondary | ICD-10-CM | POA: Diagnosis not present

## 2017-10-06 DIAGNOSIS — M5481 Occipital neuralgia: Secondary | ICD-10-CM | POA: Diagnosis not present

## 2017-10-06 DIAGNOSIS — M542 Cervicalgia: Secondary | ICD-10-CM | POA: Diagnosis not present

## 2017-10-07 DIAGNOSIS — Z23 Encounter for immunization: Secondary | ICD-10-CM | POA: Diagnosis not present

## 2017-10-07 DIAGNOSIS — Z0000 Encounter for general adult medical examination without abnormal findings: Secondary | ICD-10-CM | POA: Diagnosis not present

## 2017-10-09 DIAGNOSIS — Z0000 Encounter for general adult medical examination without abnormal findings: Secondary | ICD-10-CM | POA: Diagnosis not present

## 2017-10-09 DIAGNOSIS — Z136 Encounter for screening for cardiovascular disorders: Secondary | ICD-10-CM | POA: Diagnosis not present

## 2017-10-14 DIAGNOSIS — N200 Calculus of kidney: Secondary | ICD-10-CM | POA: Diagnosis not present

## 2017-10-20 DIAGNOSIS — M542 Cervicalgia: Secondary | ICD-10-CM | POA: Diagnosis not present

## 2017-10-20 DIAGNOSIS — M5481 Occipital neuralgia: Secondary | ICD-10-CM | POA: Diagnosis not present

## 2018-06-29 ENCOUNTER — Other Ambulatory Visit: Payer: Self-pay | Admitting: *Deleted

## 2018-06-29 ENCOUNTER — Telehealth: Payer: Self-pay | Admitting: Neurology

## 2018-06-29 DIAGNOSIS — J320 Chronic maxillary sinusitis: Secondary | ICD-10-CM

## 2018-06-29 NOTE — Telephone Encounter (Signed)
Spoke with Dr. Jaynee Eagles. Order placed for ENT referral for L chronic maxillary sinusitis.

## 2018-06-29 NOTE — Telephone Encounter (Signed)
Pt called and stated she is wanting to go ahead with the ENT referral. Please advise.

## 2018-06-29 NOTE — Telephone Encounter (Signed)
I spoke with the patient @ [phone] and discussed that ENT referral was placed. Pt understands it may be up to 2 weeks or so before she hears from the ENT. She verbalized appreciation for the call.

## 2018-07-08 ENCOUNTER — Other Ambulatory Visit: Payer: Self-pay | Admitting: Family Medicine

## 2018-07-08 DIAGNOSIS — R591 Generalized enlarged lymph nodes: Secondary | ICD-10-CM

## 2018-07-13 ENCOUNTER — Telehealth: Payer: Self-pay | Admitting: Hematology

## 2018-07-13 NOTE — Telephone Encounter (Signed)
Received a new hem referral from Dr. Leighton Ruff for abnormal cbc. Erin Costa has been cld and scheduled to see Dr. Burr Medico on 6/30 at 930am. She's been made aware to arrive 15 minutes early. I also provided the location to our facility.

## 2018-07-13 NOTE — Progress Notes (Signed)
[name]   [phone] [phone]   Clinic New Consult Note   Patient Care Team: Leighton Ruff, MD as PCP - General (Family Medicine)  Date of Service:  07/14/2018   CHIEF COMPLAINTS/PURPOSE OF CONSULTATION:  Leukopenia and cervical lymphadenopathy   REFERRING PHYSICIAN:  PCP, Dr Drema Dallas  HISTORY OF PRESENTING ILLNESS:  Erin Costa 43 y.o. female is a here because of Abnormal WBC and lymphadenopathy. The patient was referred by her PCP, Dr Drema Dallas. The patient presents to the clinic today alone.  She had normal labs in 2018 and in 2019. The low drop in blood counts in 09/2017 with WBC 3.6. Has been slowly dropping since then. She denies pain or changes around that time. In May she had a ear infection which was treated with antibiotics. She went to urgent care in June, due to left neck swelling which has been there for a while, even before her ear infection. She denies pain with the swelling. Since May her palpable left neck LN has not changed. She denied sore throat.  05/04/18 she started feeling bad and had telehealth visit with Dr Drema Dallas, she was tested for COVID which was negative. She had flu like symptoms but no fever. She was able to recover completely.   Today the patient notes she has been feeling tired and notes occasional eye puffiness. She denies any pain. She strained left lateral lower leg muscle, so she has a left boot on. When she saw Dr Lenna Gilford for RA consult due to joint pain. She has joint pain at left hip b/l shoulders and arms.   Socially she is married with 2 young children, rarely drinks, a non-smoker. She is a Education officer, museum.  They have a PMHx of kidney stones. She sees a Dealer at [facility]. She has hypoglycemia. She also has Acid Reflux which she sees Dr Collene Mares. 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 She has mirena in place so she does not have vaginal bleeding, just occasion brown discharge.  She  notes her father had lung cancer from smoking. Her grandfather had prostate cancer and her sister has Grave's Disease.    REVIEW OF SYSTEMS:   Constitutional: Denies fevers, chills or abnormal night sweats (+) fatigue  Eyes: Denies blurriness of vision, double vision or watery eyes (+) occasional eye puffiness and blurred vision Ears, nose, mouth, throat, and face: Denies mucositis or sore throat Respiratory: Denies cough, dyspnea or wheezes Cardiovascular: Denies palpitation, chest discomfort or lower extremity swelling Gastrointestinal:  Denies nausea, heartburn or change in bowel habits Skin: Denies abnormal skin rashes MSK: (+) strained left lateral lower leg muscle, has a left boot on. (+) Left hip, b/l shoulders, arm pain Lymphatics: Denies new lymphadenopathy or easy bruising (+) palpable left neck LNs Neurological:Denies numbness, tingling or new weaknesses Behavioral/Psych: Mood is stable, no new changes  All other systems were reviewed with the patient and are negative.   MEDICAL HISTORY:  Past Medical History:  Diagnosis Date  . Anemia    hx of   . Back pain   . Bursitis    to left hip  . GERD (gastroesophageal reflux disease)   . Headache   . Heart murmur   . Hypoglycemia   . Hypoglycemia   . Kidney stones    calcium oxalate and one other kind  . Palpitations   . Vitamin D deficiency     SURGICAL HISTORY: Past Surgical History:  Procedure Laterality  Date  . CYSTOSCOPY WITH RETROGRADE PYELOGRAM, URETEROSCOPY AND STENT PLACEMENT Bilateral 05/10/2016   Procedure: CYSTOSCOPY WITH RETROGRADE PYELOGRAM, URETEROSCOPY, BASKET EXTRACTION OF STONE ON LEFT;  Surgeon: Cleon Gustin, MD;  Location: [facility];  Service: Urology;  Laterality: Bilateral;  . UTERINE FIBROID SURGERY      SOCIAL HISTORY: Social History   Socioeconomic History  . Marital status: Married    Spouse name: Not on file  . Number of children: 2  . Years of education: Not on file  . Highest  education level: Master's degree (e.g., MA, MS, MEng, MEd, MSW, MBA)  Occupational History  . Occupation: Education officer, museum   Social Needs  . Financial resource strain: Not on file  . Food insecurity    Worry: Not on file    Inability: Not on file  . Transportation needs    Medical: Not on file    Non-medical: Not on file  Tobacco Use  . Smoking status: Never Smoker  . Smokeless tobacco: Never Used  Substance and Sexual Activity  . Alcohol use: Yes    Alcohol/week: 1.0 standard drinks    Types: 1 Glasses of wine per week    Comment: wine occasionally  . Drug use: No  . Sexual activity: Not on file  Lifestyle  . Physical activity    Days per week: Not on file    Minutes per session: Not on file  . Stress: Not on file  Relationships  . Social Herbalist on phone: Not on file    Gets together: Not on file    Attends religious service: Not on file    Active member of club or organization: Not on file    Attends meetings of clubs or organizations: Not on file    Relationship status: Not on file  . Intimate partner violence    Fear of current or ex partner: Not on file    Emotionally abused: Not on file    Physically abused: Not on file    Forced sexual activity: Not on file  Other Topics Concern  . Not on file  Social History Narrative   Lives at home with husband and 2 children   Right handed   Caffeine: 1 cup coffee daily and green tea 20 oz. Daily     FAMILY HISTORY: Family History  Problem Relation Age of Onset  . Stroke Mother   . Cancer Father        lung cancer  . Dementia Maternal Aunt   . Stroke Maternal Grandmother   . Graves' disease Sister     ALLERGIES:  is allergic to dovonex [calcipotriene] and other.  MEDICATIONS:  Current Outpatient Medications  Medication Sig Dispense Refill  . Biotin w/ Vitamins C & E (HAIR/SKIN/NAILS PO) Take 3 capsules by mouth daily.    . cetirizine (ZYRTEC) 10 MG tablet Take 10 mg by mouth daily.    Marland Kitchen ibuprofen  (ADVIL,MOTRIN) 600 MG tablet Take 1 tablet (600 mg total) by mouth every 6 (six) hours as needed. 30 tablet 0  . levonorgestrel (MIRENA) 20 MCG/24HR IUD 1 each by Intrauterine route once.    . Melatonin 3 MG TABS Take 6 mg by mouth at bedtime as needed.    . methylPREDNISolone (MEDROL DOSEPAK) 4 MG TBPK tablet follow package directions 21 tablet 1  . Rhodiola rosea (RHODIOLA PO) Take by mouth daily. Uses 2 droppers in water    . tacrolimus (PROTOPIC) 0.1 % ointment Apply 1 application  topically daily.      No current facility-administered medications for this visit.     PHYSICAL EXAMINATION: ECOG PERFORMANCE STATUS: 0 - Asymptomatic  Vitals:   07/14/18 0943  BP: 119/64  Pulse: 85  Resp: 18  Temp: 99 F (37.2 C)  SpO2: 100%   Filed Weights   07/14/18 0943  Weight: 120 lb 6.4 oz (54.6 kg)    GENERAL:alert, no distress and comfortable SKIN: skin color, texture, turgor are normal, no rashes or significant lesions EYES: normal, Conjunctiva are pink and non-injected, sclera clear OROPHARYNX:no exudate, no erythema and lips, buccal mucosa, and tongue normal NECK: supple, thyroid normal size, non-tender, without nodularity LYMPH: (+) 2-3 palpable  0.5-cm left lateral lower cervical LNs (+) tiny palpable right lateral lower cervical LNs, and a (+) Palpable small, movable Left axillary LN, all smooth and nontender, no palpable inguinal LN  LUNGS: clear to auscultation and percussion with normal breathing effort HEART: regular rate & rhythm and no murmurs and no lower extremity edema ABDOMEN:abdomen soft, non-tender and normal bowel sounds Musculoskeletal:no cyanosis of digits and no clubbing  NEURO: alert & oriented x 3 with fluent speech, no focal motor/sensory deficits  LABORATORY DATA:  I have reviewed the data as listed CBC Latest Ref Rng & Units 07/14/2018 06/23/2017 05/12/2016  WBC 4.0 - 10.5 K/uL 4.1 5.3 7.0  Hemoglobin 12.0 - 15.0 g/dL 11.2(L) 12.3 12.3  Hematocrit 36.0 - 46.0  % 35.6(L) 36.8 36.6  Platelets 150 - 400 K/uL 246 270 219    CMP Latest Ref Rng & Units 07/14/2018 06/23/2017 05/12/2016  Glucose 70 - 99 mg/dL 97 57(L) 91  BUN 6 - 20 mg/dL 13 17 24(H)  Creatinine 0.44 - 1.00 mg/dL 0.84 0.69 0.81  Sodium 135 - 145 mmol/L 138 141 138  Potassium 3.5 - 5.1 mmol/L 4.5 4.3 4.0  Chloride 98 - 111 mmol/L 105 105 103  CO2 22 - 32 mmol/L '25 22 28  ' Calcium 8.9 - 10.3 mg/dL 9.6 9.4 9.9  Total Protein 6.5 - 8.1 g/dL 8.6(H) 7.3 -  Total Bilirubin 0.3 - 1.2 mg/dL 0.5 0.4 -  Alkaline Phos 38 - 126 U/L 62 50 -  AST 15 - 41 U/L 22 19 -  ALT 0 - 44 U/L 22 13 -    OUTSIDE LABS             RADIOGRAPHIC STUDIES: I have personally reviewed the radiological images as listed and agreed with the findings in the report. No results found.  ASSESSMENT & PLAN:  MCKINZEE SPIRITO is a 43 y.o. African American female with a history of Vitamin D Deficiency, Eczema, Psoriasis, GERD.   1. Mild leukopenia (neutropenia and lymphopenia)  -She had recent onset of mildly low WBC with mild neutropenia and lymphopenia. First abnormal labs in 09/2017 with low WBC and has been slowly dropping since. In June she developed neutropenia (1.6) and low lymphocyte count (0.90) -She does also present with left axillary and b/l cervical mild Lymphadenopathy. No constitutional symptoms or weight loss -We discussed the possible etiology for her mild leukopenia, acute or chronic virus infection, such as EBV, Hep B/C, HIV, etc, nutritional (folate or B12 deficiency), medication related, or autoimmune related. Given her adenopathy, lymphoma is also on differential.  -Will repeat CBC with diff today and review her blood smear. Will also check HBV, HCV, HIV, EBV, SER, ANA, LDH.   If abnormal or her blood counts worsen in the next 3 months I may consider bone marrow  biopsy. She is agreeable.  -F/u in 3 months or sooner based on workup.    2. Mild anemia  -Hg 11 on 07/06/18 labs, no anemia before -She  has mirena in place so she does not have vaginal bleeding.  -Will repeat labs today, and check folate, B12 and iron study    3. Lymphadenopathy  -She has b/l palpable LNs, largest 1.5cm of left neck and less than 0.5cm of right neck. She also has small left axillary LN which is palpable.  -She will have [place] of LN on 7/2, I may consider LN biopsy based on results.  -I have low suspicion this is lymphoma, given she is asymptomatic, but may need to rule it out. This could be reactive to an infection or autoimmune disorder.  -Her prior Autoimmune workup with Dr Lenna Gilford was normal in the past.   -will check her HIV and Hep C, Rheumatology panel today.    4. Vitamin D deficiency  -Resent onset.  -On Vitamin D supplement by her PCP    5. Hypoglycemia -Unknown etiology, controlled  -managed by her PCP    PLAN:  -Labs today  -I will call her next week to review her lab and [place] results, and decide if pursue left cervical node biopsy  -Lab and f/u in 3 months, sooner if needed     Orders Placed This Encounter  Procedures  . CBC with Differential (Cancer Center Only)    Standing Status:   Future    Number of Occurrences:   1    Standing Expiration Date:   07/14/2019  . CMP ([facility] only)    Standing Status:   Future    Number of Occurrences:   1    Standing Expiration Date:   07/14/2019  . Sedimentation rate    Standing Status:   Future    Number of Occurrences:   1    Standing Expiration Date:   07/14/2019  . Lactate dehydrogenase (LDH)    Standing Status:   Future    Number of Occurrences:   1    Standing Expiration Date:   07/14/2019  . Iron and TIBC    Standing Status:   Future    Number of Occurrences:   1    Standing Expiration Date:   07/14/2019  . Ferritin    Standing Status:   Future    Number of Occurrences:   1    Standing Expiration Date:   07/14/2019  . Pathologist smear review    Standing Status:   Future    Number of Occurrences:   1    Standing Expiration  Date:   07/14/2019  . Folate RBC    Standing Status:   Future    Number of Occurrences:   1    Standing Expiration Date:   07/14/2019  . Vitamin B12    Standing Status:   Future    Number of Occurrences:   1    Standing Expiration Date:   07/14/2019  . Methylmalonic acid, serum    Standing Status:   Future    Number of Occurrences:   1    Standing Expiration Date:   07/14/2019  . Hepatitis C antibody    Standing Status:   Future    Number of Occurrences:   1    Standing Expiration Date:   07/14/2019  . Hepatitis B surface antigen    Standing Status:   Future    Number of Occurrences:  1    Standing Expiration Date:   07/14/2019  . Epstein-Barr virus VCA, IgG    Standing Status:   Future    Number of Occurrences:   1    Standing Expiration Date:   07/14/2019  . Epstein-Barr virus VCA, IgM    Standing Status:   Future    Number of Occurrences:   1    Standing Expiration Date:   07/14/2019  . Rheumatoid factor    Standing Status:   Future    Number of Occurrences:   1    Standing Expiration Date:   07/14/2019  . HIV antibody (with reflex)    Standing Status:   Future    Number of Occurrences:   1    Standing Expiration Date:   07/14/2019  . Anti-ribonucleic acid antibody    Standing Status:   Future    Number of Occurrences:   1    Standing Expiration Date:   07/14/2019  . ANA w/Reflex if Positive    All questions were answered. The patient knows to call the clinic with any problems, questions or concerns. I spent 35 minutes counseling the patient face to face. The total time spent in the appointment was 45 minutes and more than 50% was on counseling.     Truitt Merle, MD 07/14/2018 4:22 PM  I, Joslyn Devon, am acting as scribe for Truitt Merle, MD.   I have reviewed the above documentation for accuracy and completeness, and I agree with the above.

## 2018-07-14 ENCOUNTER — Encounter: Payer: Self-pay | Admitting: Hematology

## 2018-07-14 ENCOUNTER — Other Ambulatory Visit: Payer: Self-pay

## 2018-07-14 ENCOUNTER — Inpatient Hospital Stay: Payer: 59

## 2018-07-14 ENCOUNTER — Inpatient Hospital Stay: Payer: 59 | Attending: Hematology | Admitting: Hematology

## 2018-07-14 VITALS — BP 119/64 | HR 85 | Temp 99.0°F | Resp 18 | Ht 62.5 in | Wt 120.4 lb

## 2018-07-14 DIAGNOSIS — E162 Hypoglycemia, unspecified: Secondary | ICD-10-CM | POA: Diagnosis not present

## 2018-07-14 DIAGNOSIS — R590 Localized enlarged lymph nodes: Secondary | ICD-10-CM

## 2018-07-14 DIAGNOSIS — K219 Gastro-esophageal reflux disease without esophagitis: Secondary | ICD-10-CM | POA: Diagnosis not present

## 2018-07-14 DIAGNOSIS — R011 Cardiac murmur, unspecified: Secondary | ICD-10-CM | POA: Diagnosis not present

## 2018-07-14 DIAGNOSIS — Z87442 Personal history of urinary calculi: Secondary | ICD-10-CM | POA: Diagnosis not present

## 2018-07-14 DIAGNOSIS — E559 Vitamin D deficiency, unspecified: Secondary | ICD-10-CM

## 2018-07-14 DIAGNOSIS — Z79899 Other long term (current) drug therapy: Secondary | ICD-10-CM | POA: Diagnosis not present

## 2018-07-14 DIAGNOSIS — D72819 Decreased white blood cell count, unspecified: Secondary | ICD-10-CM | POA: Insufficient documentation

## 2018-07-14 DIAGNOSIS — D649 Anemia, unspecified: Secondary | ICD-10-CM | POA: Diagnosis not present

## 2018-07-14 DIAGNOSIS — Z8042 Family history of malignant neoplasm of prostate: Secondary | ICD-10-CM | POA: Diagnosis not present

## 2018-07-14 DIAGNOSIS — Z801 Family history of malignant neoplasm of trachea, bronchus and lung: Secondary | ICD-10-CM | POA: Diagnosis not present

## 2018-07-14 DIAGNOSIS — M549 Dorsalgia, unspecified: Secondary | ICD-10-CM | POA: Diagnosis not present

## 2018-07-14 LAB — CBC WITH DIFFERENTIAL (CANCER CENTER ONLY)
Abs Immature Granulocytes: 0.01 10*3/uL (ref 0.00–0.07)
Basophils Absolute: 0 10*3/uL (ref 0.0–0.1)
Basophils Relative: 0 %
Eosinophils Absolute: 0.1 10*3/uL (ref 0.0–0.5)
Eosinophils Relative: 3 %
HCT: 35.6 % — ABNORMAL LOW (ref 36.0–46.0)
Hemoglobin: 11.2 g/dL — ABNORMAL LOW (ref 12.0–15.0)
Immature Granulocytes: 0 %
Lymphocytes Relative: 34 %
Lymphs Abs: 1.4 10*3/uL (ref 0.7–4.0)
MCH: 26.2 pg (ref 26.0–34.0)
MCHC: 31.5 g/dL (ref 30.0–36.0)
MCV: 83.2 fL (ref 80.0–100.0)
Monocytes Absolute: 0.6 10*3/uL (ref 0.1–1.0)
Monocytes Relative: 14 %
Neutro Abs: 2 10*3/uL (ref 1.7–7.7)
Neutrophils Relative %: 49 %
Platelet Count: 246 10*3/uL (ref 150–400)
RBC: 4.28 MIL/uL (ref 3.87–5.11)
RDW: 13 % (ref 11.5–15.5)
WBC Count: 4.1 10*3/uL (ref 4.0–10.5)
nRBC: 0 % (ref 0.0–0.2)

## 2018-07-14 LAB — CMP (CANCER CENTER ONLY)
ALT: 22 U/L (ref 0–44)
AST: 22 U/L (ref 15–41)
Albumin: 4.2 g/dL (ref 3.5–5.0)
Alkaline Phosphatase: 62 U/L (ref 38–126)
Anion gap: 8 (ref 5–15)
BUN: 13 mg/dL (ref 6–20)
CO2: 25 mmol/L (ref 22–32)
Calcium: 9.6 mg/dL (ref 8.9–10.3)
Chloride: 105 mmol/L (ref 98–111)
Creatinine: 0.84 mg/dL (ref 0.44–1.00)
GFR, Est AFR Am: >60 mL/min (ref >60)
GFR, Estimated: >60 mL/min (ref >60)
Glucose, Bld: 97 mg/dL (ref 70–99)
Potassium: 4.5 mmol/L (ref 3.5–5.1)
Sodium: 138 mmol/L (ref 135–145)
Total Bilirubin: 0.5 mg/dL (ref 0.3–1.2)
Total Protein: 8.6 g/dL — ABNORMAL HIGH (ref 6.5–8.1)

## 2018-07-14 LAB — IRON AND TIBC
Iron: 65 ug/dL (ref 41–142)
Saturation Ratios: 19 % — ABNORMAL LOW (ref 21–57)
TIBC: 341 ug/dL (ref 236–444)
UIBC: 276 ug/dL (ref 120–384)

## 2018-07-14 LAB — FERRITIN: Ferritin: 144 ng/mL (ref 11–307)

## 2018-07-14 LAB — VITAMIN B12: Vitamin B-12: 551 pg/mL (ref 180–914)

## 2018-07-14 LAB — SEDIMENTATION RATE: Sed Rate: 38 mm/hr — ABNORMAL HIGH (ref 0–22)

## 2018-07-14 LAB — LACTATE DEHYDROGENASE: LDH: 179 U/L (ref 98–192)

## 2018-07-15 ENCOUNTER — Telehealth: Payer: Self-pay | Admitting: Hematology

## 2018-07-15 ENCOUNTER — Other Ambulatory Visit: Payer: 59

## 2018-07-15 LAB — HEPATITIS C ANTIBODY: HCV Ab: <0.1 s/co ratio (ref 0.0–0.9)

## 2018-07-15 LAB — ANA W/REFLEX IF POSITIVE: Anti Nuclear Antibody (ANA): NEGATIVE

## 2018-07-15 LAB — FOLATE RBC
Folate, Hemolysate: 308 ng/mL
Folate, RBC: 895 ng/mL (ref >498)
Hematocrit: 34.4 % (ref 34.0–46.6)

## 2018-07-15 LAB — EPSTEIN-BARR VIRUS VCA, IGG: EBV VCA IgG: 187 U/mL — ABNORMAL HIGH (ref 0.0–17.9)

## 2018-07-15 LAB — EPSTEIN-BARR VIRUS VCA, IGM: EBV VCA IgM: <36 U/mL (ref 0.0–35.9)

## 2018-07-15 LAB — RHEUMATOID FACTOR: Rheumatoid fact SerPl-aCnc: <10 IU/mL (ref 0.0–13.9)

## 2018-07-15 LAB — PATHOLOGIST SMEAR REVIEW

## 2018-07-15 LAB — HEPATITIS B SURFACE ANTIGEN: Hepatitis B Surface Ag: NEGATIVE

## 2018-07-15 NOTE — Telephone Encounter (Signed)
Scheduled appt 6/30 sch message. Spoke with patient and she is aware of her appt date and time.

## 2018-07-15 NOTE — Telephone Encounter (Signed)
Patient called to move her appt to 9/29. Rescheduled patient appt per patient request.

## 2018-07-16 ENCOUNTER — Ambulatory Visit
Admission: RE | Admit: 2018-07-16 | Discharge: 2018-07-16 | Disposition: A | Discharge status: Home / Self Care | Payer: 59 | Source: Ambulatory Visit | Attending: Family Medicine | Admitting: Family Medicine

## 2018-07-16 DIAGNOSIS — R591 Generalized enlarged lymph nodes: Secondary | ICD-10-CM

## 2018-07-16 LAB — HIV ANTIBODY (ROUTINE TESTING W REFLEX): HIV Screen 4th Generation wRfx: NONREACTIVE

## 2018-07-17 LAB — METHYLMALONIC ACID, SERUM: Methylmalonic Acid, Quantitative: 123 nmol/L (ref 0–378)

## 2018-07-23 ENCOUNTER — Telehealth: Payer: Self-pay | Admitting: Hematology

## 2018-07-23 NOTE — Telephone Encounter (Signed)
I called her back and discussed her lab results, basically was all negative, except EBV IgG was positive.  Her ultrasound of the cervical lymph nodes was unremarkable.  Her WBC differential was normal.  I think her cervical adenopathy is likely reactive, possible related to the EBV infection.  I do not think she needs lymph node biopsy, and will monitor clinically.  She agrees with the plan.  I will see her back in 3 months.  She knows to call me if she experiences worsening adenopathy or new symptoms.  Truitt Merle  07/23/2018

## 2018-10-09 NOTE — Progress Notes (Signed)
[facility]   [phone] [phone]) [phone]   Clinic Follow up Note   Patient Care Team: Leighton Ruff, MD as PCP - General (Family Medicine)  Date of Service:  10/13/2018  CHIEF COMPLAINT: Leukopenia and cervical lymphadenopathy    CURRENT THERAPY:  Observation  INTERVAL HISTORY:  Erin Costa is here for a follow up of leukopenia and lymphadenopathy. She presents to the clinic alone.  She notes she has been doing well. She has been having stool that look different lately. She notes her stool looks pencil shaped. She notes her BMs are less frequent as well. She notes she feels more full and does not eat as much do to this. She notes her abdomen feels tender. She has been able to maintain her weight. She denies any LE swelling.    REVIEW OF SYSTEMS:   Constitutional: Denies fevers, chills or abnormal weight loss (+) Decreased eating, weight stable Eyes: Denies blurriness of vision Ears, nose, mouth, throat, and face: Denies mucositis or sore throat Respiratory: Denies cough, dyspnea or wheezes Cardiovascular: Denies palpitation, chest discomfort or lower extremity swelling Gastrointestinal:  Denies nausea, heartburn (+) Constipation (+) Pencil shaped stool (+) Full feeling with abdominal tenderness  Skin: Denies abnormal skin rashes Lymphatics: Denies new lymphadenopathy or easy bruising Neurological:Denies numbness, tingling or new weaknesses Behavioral/Psych: Mood is stable, no new changes  All other systems were reviewed with the patient and are negative.  MEDICAL HISTORY:  Past Medical History:  Diagnosis Date  . Anemia    hx of   . Back pain   . Bursitis    to left hip  . GERD (gastroesophageal reflux disease)   . Headache   . Heart murmur   . Hypoglycemia   . Hypoglycemia   . Kidney stones    calcium oxalate and one other kind  . Palpitations   . Vitamin D deficiency     SURGICAL HISTORY: Past Surgical History:  Procedure  Laterality Date  . CYSTOSCOPY WITH RETROGRADE PYELOGRAM, URETEROSCOPY AND STENT PLACEMENT Bilateral 05/10/2016   Procedure: CYSTOSCOPY WITH RETROGRADE PYELOGRAM, URETEROSCOPY, BASKET EXTRACTION OF STONE ON LEFT;  Surgeon: Cleon Gustin, MD;  Location: [facility];  Service: Urology;  Laterality: Bilateral;  . UTERINE FIBROID SURGERY      I have reviewed the social history and family history with the patient and they are unchanged from previous note.  ALLERGIES:  is allergic to dovonex [calcipotriene] and other.  MEDICATIONS:  Current Outpatient Medications  Medication Sig Dispense Refill  . Biotin w/ Vitamins C & E (HAIR/SKIN/NAILS PO) Take 3 capsules by mouth daily.    . cetirizine (ZYRTEC) 10 MG tablet Take 10 mg by mouth daily.    . diclofenac sodium (VOLTAREN) 1 % GEL Apply 4 g topically 3 (three) times daily as needed.    Marland Kitchen ibuprofen (ADVIL,MOTRIN) 600 MG tablet Take 1 tablet (600 mg total) by mouth every 6 (six) hours as needed. 30 tablet 0  . levonorgestrel (MIRENA) 20 MCG/24HR IUD 1 each by Intrauterine route once.    . Melatonin 3 MG TABS Take 6 mg by mouth at bedtime as needed.    . methylPREDNISolone (MEDROL DOSEPAK) 4 MG TBPK tablet follow package directions 21 tablet 1  . Rhodiola rosea (RHODIOLA PO) Take by mouth daily. Uses 2 droppers in water    . tacrolimus (PROTOPIC) 0.1 % ointment Apply 1 application topically daily.     . Vitamin D, Ergocalciferol, (DRISDOL) 1.25 MG (50000 UT) CAPS capsule  Take 1,250 Units by mouth daily.     No current facility-administered medications for this visit.     PHYSICAL EXAMINATION: ECOG PERFORMANCE STATUS: 1 - Symptomatic but completely ambulatory  Vitals:   10/13/18 0948  BP: 123/67  Pulse: 73  Resp: 18  Temp: 98.2 F (36.8 C)  SpO2: 99%   Filed Weights   10/13/18 0948  Weight: 122 lb 12.8 oz (55.7 kg)    GENERAL:alert, no distress and comfortable SKIN: skin color, texture, turgor are normal, no rashes or significant  lesions EYES: normal, Conjunctiva are pink and non-injected, sclera clear  NECK: supple, thyroid normal size, non-tender, without nodularity LYMPH: (+) 0.5-cm left lateral lower cervical LNs (+) No longer palpable right lateral lower cervical LNs (+) stable or smaller Palpable movable Left axillary LN, all smooth and nontender. No palpable inguinal LN  LUNGS: clear to auscultation and percussion with normal breathing effort HEART: regular rate & rhythm and no murmurs and no lower extremity edema ABDOMEN:abdomen soft, non-tender and normal bowel sounds Musculoskeletal:no cyanosis of digits and no clubbing  NEURO: alert & oriented x 3 with fluent speech, no focal motor/sensory deficits  LABORATORY DATA:  I have reviewed the data as listed CBC Latest Ref Rng & Units 10/13/2018 07/14/2018 07/14/2018  WBC 4.0 - 10.5 K/uL 4.5 4.1 -  Hemoglobin 12.0 - 15.0 g/dL 13.5 11.2(L) -  Hematocrit 36.0 - 46.0 % 41.5 34.4 35.6(L)  Platelets 150 - 400 K/uL 257 246 -     CMP Latest Ref Rng & Units 07/14/2018 06/23/2017 05/12/2016  Glucose 70 - 99 mg/dL 97 57(L) 91  BUN 6 - 20 mg/dL 13 17 24(H)  Creatinine 0.44 - 1.00 mg/dL 0.84 0.69 0.81  Sodium 135 - 145 mmol/L 138 141 138  Potassium 3.5 - 5.1 mmol/L 4.5 4.3 4.0  Chloride 98 - 111 mmol/L 105 105 103  CO2 22 - 32 mmol/L 25 22 28   Calcium 8.9 - 10.3 mg/dL 9.6 9.4 9.9  Total Protein 6.5 - 8.1 g/dL 8.6(H) 7.3 -  Total Bilirubin 0.3 - 1.2 mg/dL 0.5 0.4 -  Alkaline Phos 38 - 126 U/L 62 50 -  AST 15 - 41 U/L 22 19 -  ALT 0 - 44 U/L 22 13 -      RADIOGRAPHIC STUDIES: I have personally reviewed the radiological images as listed and agreed with the findings in the report. No results found.   ASSESSMENT & PLAN:  Erin Costa is a 43 y.o. female with   1. Mild leukopenia (neutropenia and lymphopenia)  -She had onset of mildly low WBC with mild neutropenia and lymphopenia. First abnormal labs in 09/2017 with low WBC and has been slowly dropping since. In June  2020 she developed neutropenia (1.6) and low lymphocyte count (0.90).  -She does also present with left axillary and b/l cervical mild Lymphadenopathy. No constitutional symptoms or weight loss. Her [place] was unremarkable. This is likely reactive.  -Further lab work up of Hep B/C, HIV, Etc was negative except her EBV VCA IgG was 187.  -CBC reviewed today, WBC and diff were WNL, no anemia ir thrombocytopenia. -I discussed it is possible that she may have cyclic neutropenia which is benign, no further work up needed for now.  I discussed she is fine to continue to be monitored by her PCP and I can release her from my care. She is agreeable.  -Will f/u with her as needed in the future, especially if recurrence shows worsened blood counts.  2. Mild anemia  -Hg 11 on 07/06/18 labs, no anemia before -She has mirena in place so she does not have vaginal bleeding.  -Repeat iron panel and B12 was overall normal in 06/2018.  -Currently resolved on labs today, Hg 13.5 (10/13/18)  3. Lymphadenopathy  -She has b/l palpable LNs, largest 1.5cm of left neck and less than 0.5cm of right neck. She also has small left axillary LN which is palpable on initial exam in 06/2018.  -Her prior Autoimmune workup with Dr Lenna Gilford was normal in the past.   -Her HIV and Hep C, Rheumatology panel were negative.  -Her 07/16/18 [place] of neck was unremarkable.  -On exam today (10/13/18) she has 0.5cm palpable left cervical LN which is smaller, her right cervical LNs are no longer palpable. Overall this is improving.  -I think her cervical adenopathy is likely reactive, possible related to the EBV infection. I do not think she needs lymph node biopsy, and will monitor clinically.  She agrees with the plan.      4. Constipation and early satiety -She has been eating less due to recent full feeling in her lower neck. She has been able to maintain her weight. -She also has been have less bowel movements. Her stool has also been pencil  shaped lately. No GI bleeding.  -Per patient her father had colon issues and had unknown type of cancer at young age.  -I recommend she f/u with Dr. Collene Mares.    PLAN:  -lab reviewed, leukopenia resolved, normal CBC  -f/u as needed in the future    No problem-specific Assessment & Plan notes found for this encounter.   No orders of the defined types were placed in this encounter.  All questions were answered. The patient knows to call the clinic with any problems, questions or concerns. No barriers to learning was detected. I spent 15 minutes counseling the patient face to face. The total time spent in the appointment was 20 minutes and more than 50% was on counseling and review of test results     Truitt Merle, MD 10/13/2018   I, Joslyn Devon, am acting as scribe for Truitt Merle, MD.   I have reviewed the above documentation for accuracy and completeness, and I agree with the above.

## 2018-10-12 ENCOUNTER — Other Ambulatory Visit: Payer: Self-pay

## 2018-10-12 DIAGNOSIS — D72819 Decreased white blood cell count, unspecified: Secondary | ICD-10-CM | POA: Insufficient documentation

## 2018-10-12 DIAGNOSIS — R591 Generalized enlarged lymph nodes: Secondary | ICD-10-CM | POA: Insufficient documentation

## 2018-10-13 ENCOUNTER — Other Ambulatory Visit: Payer: Self-pay | Admitting: Hematology

## 2018-10-13 ENCOUNTER — Inpatient Hospital Stay: Payer: 59 | Attending: Hematology

## 2018-10-13 ENCOUNTER — Telehealth: Payer: Self-pay | Admitting: Hematology

## 2018-10-13 ENCOUNTER — Other Ambulatory Visit: Payer: Self-pay

## 2018-10-13 ENCOUNTER — Inpatient Hospital Stay: Payer: 59 | Admitting: Hematology

## 2018-10-13 ENCOUNTER — Encounter: Payer: Self-pay | Admitting: Hematology

## 2018-10-13 DIAGNOSIS — Z793 Long term (current) use of hormonal contraceptives: Secondary | ICD-10-CM | POA: Diagnosis not present

## 2018-10-13 DIAGNOSIS — Z87442 Personal history of urinary calculi: Secondary | ICD-10-CM | POA: Diagnosis not present

## 2018-10-13 DIAGNOSIS — R590 Localized enlarged lymph nodes: Secondary | ICD-10-CM | POA: Diagnosis not present

## 2018-10-13 DIAGNOSIS — R591 Generalized enlarged lymph nodes: Secondary | ICD-10-CM | POA: Diagnosis not present

## 2018-10-13 DIAGNOSIS — Z791 Long term (current) use of non-steroidal anti-inflammatories (NSAID): Secondary | ICD-10-CM | POA: Insufficient documentation

## 2018-10-13 DIAGNOSIS — D72819 Decreased white blood cell count, unspecified: Secondary | ICD-10-CM | POA: Insufficient documentation

## 2018-10-13 DIAGNOSIS — D649 Anemia, unspecified: Secondary | ICD-10-CM | POA: Diagnosis not present

## 2018-10-13 DIAGNOSIS — K5900 Constipation, unspecified: Secondary | ICD-10-CM | POA: Insufficient documentation

## 2018-10-13 LAB — CBC WITH DIFFERENTIAL (CANCER CENTER ONLY)
Abs Immature Granulocytes: 0.01 10*3/uL (ref 0.00–0.07)
Basophils Absolute: 0 10*3/uL (ref 0.0–0.1)
Basophils Relative: 1 %
Eosinophils Absolute: 0.1 10*3/uL (ref 0.0–0.5)
Eosinophils Relative: 1 %
HCT: 41.5 % (ref 36.0–46.0)
Hemoglobin: 13.5 g/dL (ref 12.0–15.0)
Immature Granulocytes: 0 %
Lymphocytes Relative: 31 %
Lymphs Abs: 1.4 10*3/uL (ref 0.7–4.0)
MCH: 27.1 pg (ref 26.0–34.0)
MCHC: 32.5 g/dL (ref 30.0–36.0)
MCV: 83.2 fL (ref 80.0–100.0)
Monocytes Absolute: 0.4 10*3/uL (ref 0.1–1.0)
Monocytes Relative: 9 %
Neutro Abs: 2.6 10*3/uL (ref 1.7–7.7)
Neutrophils Relative %: 58 %
Platelet Count: 257 10*3/uL (ref 150–400)
RBC: 4.99 MIL/uL (ref 3.87–5.11)
RDW: 12.9 % (ref 11.5–15.5)
WBC Count: 4.5 10*3/uL (ref 4.0–10.5)
nRBC: 0 % (ref 0.0–0.2)

## 2018-10-13 LAB — LACTATE DEHYDROGENASE: LDH: 168 U/L (ref 98–192)

## 2018-10-13 NOTE — Telephone Encounter (Signed)
Per 9/29 los F/u as needed

## 2018-10-15 ENCOUNTER — Other Ambulatory Visit: Payer: 59

## 2018-10-15 ENCOUNTER — Ambulatory Visit: Payer: 59 | Admitting: Hematology

## 2018-12-18 ENCOUNTER — Other Ambulatory Visit: Payer: Self-pay

## 2018-12-18 ENCOUNTER — Emergency Department (HOSPITAL_BASED_OUTPATIENT_CLINIC_OR_DEPARTMENT_OTHER)
Admission: EM | Admit: 2018-12-18 | Discharge: 2018-12-19 | Disposition: A | Discharge status: Home / Self Care | Payer: 59 | Attending: Emergency Medicine | Admitting: Emergency Medicine

## 2018-12-18 ENCOUNTER — Encounter (HOSPITAL_BASED_OUTPATIENT_CLINIC_OR_DEPARTMENT_OTHER): Payer: Self-pay

## 2018-12-18 DIAGNOSIS — Z888 Allergy status to other drugs, medicaments and biological substances status: Secondary | ICD-10-CM | POA: Diagnosis not present

## 2018-12-18 DIAGNOSIS — Z79899 Other long term (current) drug therapy: Secondary | ICD-10-CM | POA: Insufficient documentation

## 2018-12-18 DIAGNOSIS — R011 Cardiac murmur, unspecified: Secondary | ICD-10-CM | POA: Insufficient documentation

## 2018-12-18 DIAGNOSIS — M792 Neuralgia and neuritis, unspecified: Secondary | ICD-10-CM | POA: Diagnosis not present

## 2018-12-18 DIAGNOSIS — M542 Cervicalgia: Secondary | ICD-10-CM | POA: Diagnosis present

## 2018-12-18 MED ORDER — HYDROCODONE-ACETAMINOPHEN 5-325 MG PO TABS: 1.0000 | ORAL_TABLET | Freq: Four times a day (QID) | ORAL | 0 refills | Status: DC | PRN

## 2018-12-18 NOTE — ED Provider Notes (Signed)
[facility] Provider Note: Georgena Spurling, MD, FACEP  CSN: [id number] MRN: [id number] ARRIVAL: 12/18/18 at [place]  Neck Pain   HISTORY OF PRESENT ILLNESS  12/18/18 11:34 PM Erin Costa is a 43 y.o. female with a 1 day history of pain in the left posterior neck.  The pain radiates to her left shoulder and sometimes down to her left fingers on the ulnar side of her left hand.  She describes this radiating pain as an electricity.  The pain is worse with movement of the neck.  She rates the pain is a 4 out of 10 at its worst.  It is sometimes difficult to find a comfortable position.  She feels like her left arm is somewhat weaker than her right arm.  She has previously seen a Dr. Jaynee Eagles of [facility] for neck pain and occipital neuralgia; she an MRI showed some degenerative changes in her cervical spine.  This evening she is also developed shooting pain in the left side of her face that lasted about 30 minutes.  She is no longer having pain but is now having paresthesias of the left side of the face.  There is no associated facial droop.    Past Medical History:  Diagnosis Date  . Anemia    hx of   . Back pain   . Bursitis    to left hip  . GERD (gastroesophageal reflux disease)   . Headache   . Heart murmur   . Hypoglycemia   . Hypoglycemia   . Kidney stones    calcium oxalate and one other kind  . Palpitations   . Vitamin D deficiency     Past Surgical History:  Procedure Laterality Date  . CYSTOSCOPY WITH RETROGRADE PYELOGRAM, URETEROSCOPY AND STENT PLACEMENT Bilateral 05/10/2016   Procedure: CYSTOSCOPY WITH RETROGRADE PYELOGRAM, URETEROSCOPY, BASKET EXTRACTION OF STONE ON LEFT;  Surgeon: Cleon Gustin, MD;  Location: [facility];  Service: Urology;  Laterality: Bilateral;  . UTERINE FIBROID SURGERY      Family History  Problem Relation Age of Onset  . Stroke Mother   . Cancer Father        lung cancer   . Dementia Maternal Aunt   . Stroke Maternal Grandmother   . Graves' disease Sister     Social History   Tobacco Use  . Smoking status: Never Smoker  . Smokeless tobacco: Never Used  Substance Use Topics  . Alcohol use: Yes    Comment: wine occasionally  . Drug use: No    Prior to Admission medications   Medication Sig Start Date End Date Taking? Authorizing Provider  Biotin w/ Vitamins C & E (HAIR/SKIN/NAILS PO) Take 3 capsules by mouth daily.    [provider]  cetirizine (ZYRTEC) 10 MG tablet Take 10 mg by mouth daily.    [provider]  diclofenac sodium (VOLTAREN) 1 % GEL Apply 4 g topically 3 (three) times daily as needed. 06/11/18   [provider]  HYDROcodone-acetaminophen (NORCO) 5-325 MG tablet Take 1 tablet by mouth every 6 (six) hours as needed for severe pain. 12/18/18   Reg Bircher, MD  ibuprofen (ADVIL,MOTRIN) 600 MG tablet Take 1 tablet (600 mg total) by mouth every 6 (six) hours as needed. 05/13/16   Rancour, Annie Main, MD  levonorgestrel (MIRENA) 20 MCG/24HR IUD 1 each by Intrauterine route once.    [provider]  Melatonin 3 MG TABS Take 6 mg  by mouth at bedtime as needed.    [provider]  methylPREDNISolone (MEDROL DOSEPAK) 4 MG TBPK tablet follow package directions 06/23/17   Melvenia Beam, MD  Rhodiola rosea (RHODIOLA PO) Take by mouth daily. Uses 2 droppers in water    [provider]  tacrolimus (PROTOPIC) 0.1 % ointment Apply 1 application topically daily.     [provider]  Vitamin D, Ergocalciferol, (DRISDOL) 1.25 MG (50000 UT) CAPS capsule Take 1,250 Units by mouth daily. 07/07/18   [provider]    Allergies Dovonex [calcipotriene] and Other   REVIEW OF SYSTEMS  Negative except as noted here or in the History of Present Illness.   PHYSICAL EXAMINATION  Initial Vital Signs Blood pressure 123/74, pulse 76, temperature 98.8 F (37.1 C), temperature source Oral, resp.  rate 20, height 5\' 2"  (1.575 m), weight 56.2 kg, SpO2 100 %.  Examination General: Well-developed, well-nourished female in no acute distress; appears younger than age of record HENT: normocephalic; atraumatic Eyes: pupils equal, round and reactive to light; extraocular muscles intact Neck: supple; movement of the neck reproduces pain in neck radiating to left shoulder Heart: regular rate and rhythm; no murmurs, rubs or gallops Lungs: clear to auscultation bilaterally Abdomen: soft; nondistended; nontender; bowel sounds present Extremities: No deformity; full range of motion; pulses normal Neurologic: Awake, alert and oriented; motor function intact in all extremities and symmetric except slight weakness in left upper extremity that may be effort dependent due to pain; sensation intact and symmetric in upper extremities, subjectively altered sensation on left side of face; no facial droop Skin: Warm and dry Psychiatric: Normal mood and affect   RESULTS  Summary of this visit's results, reviewed and interpreted by myself:   EKG Interpretation  Date/Time:  Friday December 18 2018 19:16:11 EST Ventricular Rate:  80 PR Interval:  146 QRS Duration: 90 QT Interval:  358 QTC Calculation: 412 R Axis:   133 Text Interpretation: Normal sinus rhythm Left posterior fascicular block Minimal voltage criteria for LVH, may be normal variant ( Sokolow-Lyon ) Nonspecific T wave abnormality Abnormal ECG No previous ECGs available Confirmed by Shanon Rosser [phone]) on 12/18/2018 11:09:37 PM      Laboratory Studies: No results found for this or any previous visit (from the past 24 hour(s)). Imaging Studies: No results found.  ED COURSE and MDM  Nursing notes, initial and subsequent vitals signs, including pulse oximetry, reviewed and interpreted by myself.  Vitals:   12/18/18 1914 12/18/18 2328  BP: 133/79 123/74  Pulse: 80 76  Resp: 20 20  Temp: 98.8 F (37.1 C)   TempSrc: Oral   SpO2: 99%  100%  Weight: 56.2 kg   Height: 5\' 2"  (1.575 m)    Medications - No data to display  The patient's left neck pain radiating to the left arm is consistent with a cervical radiculopathy.  The facial symptoms are consistent with a trigeminal neuralgia.  It is puzzling why both would occur at the same time.  Her symptoms are more consistent with peripheral neuropathy and not a central process such as a stroke as these would not be expected to cause pain.  We will refer her back to Dr. Jaynee Eagles for further evaluation.  A message was sent to Dr. Jaynee Eagles through [place] [facility].  PROCEDURES  Procedures   ED DIAGNOSES     ICD-10-CM   1. Neuropathic pain  M79.2        Samariya Rockhold, Jenny Reichmann, MD 12/19/18 0004

## 2018-12-18 NOTE — ED Triage Notes (Addendum)
Pt c/o pain to left side of neck started last night- radiates to left UE and left jaw/face and feels hot x 2-3 hours-pt reports hx of "arthritis to my neck" -NAD-steady gait

## 2019-02-05 ENCOUNTER — Ambulatory Visit (INDEPENDENT_AMBULATORY_CARE_PROVIDER_SITE_OTHER): Payer: 59 | Admitting: Neurology

## 2019-02-05 ENCOUNTER — Encounter: Payer: Self-pay | Admitting: Neurology

## 2019-02-05 ENCOUNTER — Other Ambulatory Visit: Payer: Self-pay

## 2019-02-05 VITALS — BP 114/68 | HR 82 | Temp 97.8°F | Ht 62.5 in | Wt 122.4 lb

## 2019-02-05 DIAGNOSIS — R29898 Other symptoms and signs involving the musculoskeletal system: Secondary | ICD-10-CM

## 2019-02-05 DIAGNOSIS — M79602 Pain in left arm: Secondary | ICD-10-CM

## 2019-02-05 DIAGNOSIS — G379 Demyelinating disease of central nervous system, unspecified: Secondary | ICD-10-CM

## 2019-02-05 DIAGNOSIS — M5412 Radiculopathy, cervical region: Secondary | ICD-10-CM | POA: Diagnosis not present
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
## 2019-02-05 NOTE — Patient Instructions (Addendum)
- MRI brain and cervical spine - Dr. Maryjean Ka for epidural steroid injections into the cervical spine - [facility] - EMG/NCS  Epidural Steroid Injection Patient Information  Description: The epidural space surrounds the nerves as they exit the spinal cord.  In some patients, the nerves can be compressed and inflamed by a bulging disc or a tight spinal canal (spinal stenosis).  By injecting steroids into the epidural space, we can bring irritated nerves into direct contact with a potentially helpful medication.  These steroids act directly on the irritated nerves and can reduce swelling and inflammation which often leads to decreased pain.  Epidural steroids may be injected anywhere along the spine and from the neck to the low back depending upon the location of your pain.   After numbing the skin with local anesthetic (like Novocaine), a small needle is passed into the epidural space slowly.  You may experience a sensation of pressure while this is being done.  The entire block usually last less than 10 minutes.  Conditions which may be treated by epidural steroids:   Low back and leg pain  Neck and arm pain  Spinal stenosis  Post-laminectomy syndrome  Herpes zoster (shingles) pain  Pain from compression fractures  Preparation for the injection:  1. Do not eat any solid food or dairy products within 8 hours of your appointment.  2. You may drink clear liquids up to 3 hours before appointment.  Clear liquids include water, black coffee, juice or soda.  No milk or cream please. 3. You may take your regular medication, including pain medications, with a sip of water before your appointment  Diabetics should hold regular insulin (if taken separately) and take 1/2 normal NPH dos the morning of the procedure.  Carry some sugar containing items with you to your appointment. 4. A driver must accompany you and be prepared to drive you home after your procedure.  5. Bring all your  current medications with your. 6. An IV may be inserted and sedation may be given at the discretion of the physician.   7. A blood pressure cuff, EKG and other monitors will often be applied during the procedure.  Some patients may need to have extra oxygen administered for a short period. 8. You will be asked to provide medical information, including your allergies, prior to the procedure.  We must know immediately if you are taking blood thinners (like Coumadin/Warfarin)  Or if you are allergic to IV iodine contrast (dye). We must know if you could possible be pregnant.  Possible side-effects:  Bleeding from needle site  Infection (rare, may require surgery)  Nerve injury (rare)  Numbness & tingling (temporary)  Difficulty urinating (rare, temporary)  Spinal headache ( a headache worse with upright posture)  Light -headedness (temporary)  Pain at injection site (several days)  Decreased blood pressure (temporary)  Weakness in arm/leg (temporary)  Pressure sensation in back/neck (temporary)  Call if you experience:  Fever/chills associated with headache or increased back/neck pain.  Headache worsened by an upright position.  New onset weakness or numbness of an extremity below the injection site  Hives or difficulty breathing (go to the emergency room)  Inflammation or drainage at the infection site  Severe back/neck pain  Any new symptoms which are concerning to you  Please note:  Although the local anesthetic injected can often make your back or neck feel good for several hours after the injection, the pain will likely return.  It takes 3-7 days  for steroids to work in the epidural space.  You may not notice any pain relief for at least that one week.  If effective, we will often do a series of three injections spaced 3-6 weeks apart to maximally decrease your pain.  After the initial series, we generally will wait several months before considering a repeat  injection of the same type.  If you have any questions, please call [phone] [facility]  Electromyoneurogram Electromyoneurogram is a test to check how well your muscles and nerves are working. This procedure includes the combined use of electromyogram (EMG) and nerve conduction study (NCS). EMG is used to look for muscular disorders. NCS, which is also called electroneurogram, measures how well your nerves are controlling your muscles. The procedures are usually done together to check if your muscles and nerves are healthy. If the results of the tests are abnormal, this may indicate disease or injury, such as a neuromuscular disease or peripheral nerve damage. Tell a health care provider about:  Any allergies you have.  All medicines you are taking, including vitamins, herbs, eye drops, creams, and over-the-counter medicines.  Any problems you or family members have had with anesthetic medicines.  Any blood disorders you have.  Any surgeries you have had.  Any medical conditions you have.  If you have a pacemaker.  Whether you are pregnant or may be pregnant. What are the risks? Generally, this is a safe procedure. However, problems may occur, including:  Infection where the electrodes were inserted.  Bleeding. What happens before the procedure? Medicines Ask your health care provider about:  Changing or stopping your regular medicines. This is especially important if you are taking diabetes medicines or blood thinners.  Taking medicines such as aspirin and ibuprofen. These medicines can thin your blood. Do not take these medicines unless your health care provider tells you to take them.  Taking over-the-counter medicines, vitamins, herbs, and supplements. General instructions  Your health care provider may ask you to avoid: ? Beverages that have caffeine, such as coffee and tea. ? Any products that contain nicotine or tobacco.  These products include cigarettes, e-cigarettes, and chewing tobacco. If you need help quitting, ask your health care provider.  Do not use lotions or creams on the same day that you will be having the procedure. What happens during the procedure? For EMG   Your health care provider will ask you to stay in a position so that he or she can access the muscle that will be studied. You may be standing, sitting, or lying down.  You may be given a medicine that numbs the area (local anesthetic).  A very thin needle that has an electrode will be inserted into your muscle.  Another small electrode will be placed on your skin near the muscle.  Your health care provider will ask you to continue to remain still.  The electrodes will send a signal that tells about the electrical activity of your muscles. You may see this on a monitor or hear it in the room.  After your muscles have been studied at rest, your health care provider will ask you to contract or flex your muscles. The electrodes will send a signal that tells about the electrical activity of your muscles.  Your health care provider will remove the electrodes and the electrode needles when the procedure is finished. The procedure may vary among health care providers and hospitals. For NCS   An electrode that records  your nerve activity (recording electrode) will be placed on your skin by the muscle that is being studied.  An electrode that is used as a reference (reference electrode) will be placed near the recording electrode.  A paste or gel will be applied to your skin between the recording electrode and the reference electrode.  Your nerve will be stimulated with a mild shock. Your health care provider will measure how much time it takes for your muscle to react.  Your health care provider will remove the electrodes and the gel when the procedure is finished. The procedure may vary among health care providers and hospitals. What  happens after the procedure?  It is up to you to get the results of your procedure. Ask your health care provider, or the department that is doing the procedure, when your results will be ready.  Your health care provider may: ? Give you medicines for any pain. ? Monitor the insertion sites to make sure that bleeding stops. Summary  Electromyoneurogram is a test to check how well your muscles and nerves are working.  If the results of the tests are abnormal, this may indicate disease or injury.  This is a safe procedure. However, problems may occur, such as bleeding and infection.  Your health care provider will do two tests to complete this procedure. One checks your muscles (EMG) and another checks your nerves (NCS).  It is up to you to get the results of your procedure. Ask your health care provider, or the department that is doing the procedure, when your results will be ready. This information is not intended to replace advice given to you by your health care provider. Make sure you discuss any questions you have with your health care provider. Document Revised: 09/16/2017 Document Reviewed: 08/29/2017 Elsevier Patient Education  [place].

## 2019-02-17 ENCOUNTER — Ambulatory Visit: Payer: 59

## 2019-02-17 DIAGNOSIS — M79602 Pain in left arm: Secondary | ICD-10-CM

## 2019-02-17 DIAGNOSIS — G379 Demyelinating disease of central nervous system, unspecified: Secondary | ICD-10-CM

## 2019-02-17 DIAGNOSIS — R29898 Other symptoms and signs involving the musculoskeletal system: Secondary | ICD-10-CM

## 2019-02-17 MED ORDER — GADOBENATE DIMEGLUMINE 529 MG/ML IV SOLN
10.0000 mL | Freq: Once | INTRAVENOUS | Status: AC | PRN
  Administered 2019-02-17: 17:00:00 10 mL via INTRAVENOUS

## 2019-02-18 ENCOUNTER — Telehealth: Payer: Self-pay | Admitting: *Deleted

## 2019-02-18 NOTE — Telephone Encounter (Signed)
Spoke with pt and advised MRI brain & C-spine normal. EMG/NCV on 03/11/19 @ 7:45 AM. Pt verbalized appreciation.

## 2019-02-18 NOTE — Telephone Encounter (Signed)
-----   Message from Melvenia Beam, MD sent at 02/18/2019  4:24 PM EST ----- MRI of the brain and cervical spine normal thanks

## 2019-03-11 ENCOUNTER — Ambulatory Visit: Payer: 59 | Admitting: Neurology

## 2019-03-11 ENCOUNTER — Other Ambulatory Visit: Payer: Self-pay

## 2019-03-11 ENCOUNTER — Other Ambulatory Visit: Payer: Self-pay | Admitting: Neurology

## 2019-03-11 MED ORDER — DICLOFENAC SODIUM 1 % EX GEL: 4.0000 g | Freq: Four times a day (QID) | CUTANEOUS | 6 refills | Status: DC

## 2019-04-08 ENCOUNTER — Other Ambulatory Visit: Payer: Self-pay

## 2019-04-08 ENCOUNTER — Encounter (HOSPITAL_COMMUNITY): Payer: Self-pay | Admitting: Emergency Medicine

## 2019-04-08 ENCOUNTER — Emergency Department (HOSPITAL_COMMUNITY): Payer: 59

## 2019-04-08 ENCOUNTER — Emergency Department (HOSPITAL_COMMUNITY)
Admission: EM | Admit: 2019-04-08 | Discharge: 2019-04-08 | Disposition: A | Discharge status: Home / Self Care | Payer: 59 | Attending: Emergency Medicine | Admitting: Emergency Medicine

## 2019-04-08 ENCOUNTER — Emergency Department (HOSPITAL_COMMUNITY)
Admission: EM | Admit: 2019-04-08 | Discharge: 2019-04-08 | Disposition: A | Discharge status: Home / Self Care | Payer: 59 | Source: Home / Self Care

## 2019-04-08 ENCOUNTER — Ambulatory Visit (HOSPITAL_BASED_OUTPATIENT_CLINIC_OR_DEPARTMENT_OTHER)
Admission: RE | Admit: 2019-04-08 | Discharge: 2019-04-08 | Disposition: A | Discharge status: Home / Self Care | Payer: 59 | Source: Ambulatory Visit | Attending: Emergency Medicine | Admitting: Emergency Medicine

## 2019-04-08 DIAGNOSIS — Z79899 Other long term (current) drug therapy: Secondary | ICD-10-CM | POA: Insufficient documentation

## 2019-04-08 DIAGNOSIS — Z5321 Procedure and treatment not carried out due to patient leaving prior to being seen by health care provider: Secondary | ICD-10-CM | POA: Insufficient documentation

## 2019-04-08 DIAGNOSIS — R52 Pain, unspecified: Secondary | ICD-10-CM

## 2019-04-08 DIAGNOSIS — Z975 Presence of (intrauterine) contraceptive device: Secondary | ICD-10-CM | POA: Insufficient documentation

## 2019-04-08 DIAGNOSIS — M79605 Pain in left leg: Secondary | ICD-10-CM | POA: Insufficient documentation

## 2019-04-08 DIAGNOSIS — M79672 Pain in left foot: Secondary | ICD-10-CM | POA: Diagnosis not present

## 2019-04-08 MED ORDER — KETOROLAC TROMETHAMINE 60 MG/2ML IM SOLN
60.0000 mg | Freq: Once | INTRAMUSCULAR | Status: AC
  Administered 2019-04-08: 60 mg via INTRAMUSCULAR
  Filled 2019-04-08: qty 2

## 2019-04-08 MED ORDER — ACETAMINOPHEN 500 MG PO TABS
1000.0000 mg | ORAL_TABLET | Freq: Once | ORAL | Status: AC
  Administered 2019-04-08: 1000 mg via ORAL
  Filled 2019-04-08: qty 2

## 2019-04-08 MED ORDER — NAPROXEN 375 MG PO TABS: 375.0000 mg | ORAL_TABLET | Freq: Two times a day (BID) | ORAL | 0 refills | Status: DC

## 2019-04-08 MED ORDER — LIDOCAINE 5 % EX PTCH: 1.0000 | MEDICATED_PATCH | CUTANEOUS | 0 refills | Status: DC

## 2019-04-08 MED ORDER — LIDOCAINE 5 % EX PTCH
2.0000 | MEDICATED_PATCH | CUTANEOUS | Status: DC
  Administered 2019-04-08: 2 via TRANSDERMAL
  Filled 2019-04-08: qty 2

## 2019-04-08 NOTE — ED Notes (Signed)
Pt chooses to [facility]

## 2019-04-08 NOTE — ED Provider Notes (Signed)
[facility] DEPT Provider Note   CSN: [id number] Arrival date & time: 04/08/19  0249     History No chief complaint on file.   Erin Costa is a 44 y.o. female.  The history is provided by the patient.  Foot Pain This is a new problem. The current episode started less than 1 hour ago. The problem occurs constantly. The problem has not changed since onset.Pertinent negatives include no chest pain, no abdominal pain, no headaches and no shortness of breath. Nothing aggravates the symptoms. Nothing relieves the symptoms. She has tried nothing for the symptoms. The treatment provided no relief.  Patient with back pain and neck pain scheduled for an EMG on 4/5 presents with foot and ankle pain that is sharp coming in waves like "labor pains" since getting out of the shower this am.  No fall.  No trauma.  No wounds, no redness.  No back pain or neck pain.  No weakness nor numbness.       Past Medical History:  Diagnosis Date  . Anemia    hx of   . Back pain   . Bursitis    to left hip  . GERD (gastroesophageal reflux disease)   . Headache   . Heart murmur   . Hypoglycemia   . Hypoglycemia   . Kidney stones    calcium oxalate and one other kind  . Palpitations   . Vitamin D deficiency     Patient Active Problem List   Diagnosis Date Noted  . Leukopenia 10/12/2018  . Lymphadenopathy 10/12/2018  . Radiculopathy 08/13/2017  . Migraine 06/23/2017  . Occipital neuralgia 06/23/2017  . Insomnia 02/21/2016  . Anxiety 02/21/2016  . Tension headache 02/21/2016    Past Surgical History:  Procedure Laterality Date  . CYSTOSCOPY WITH RETROGRADE PYELOGRAM, URETEROSCOPY AND STENT PLACEMENT Bilateral 05/10/2016   Procedure: CYSTOSCOPY WITH RETROGRADE PYELOGRAM, URETEROSCOPY, BASKET EXTRACTION OF STONE ON LEFT;  Surgeon: Cleon Gustin, MD;  Location: [facility];  Service: Urology;  Laterality: Bilateral;  . UTERINE FIBROID SURGERY       OB History   No  obstetric history on file.     Family History  Problem Relation Age of Onset  . Stroke Mother   . Cancer Father        lung cancer  . Dementia Maternal Aunt   . Stroke Maternal Grandmother   . Graves' disease Sister     Social History   Tobacco Use  . Smoking status: Never Smoker  . Smokeless tobacco: Never Used  Substance Use Topics  . Alcohol use: Yes    Comment: wine occasionally  . Drug use: No    Home Medications Prior to Admission medications   Medication Sig Start Date End Date Taking? Authorizing Provider  cetirizine (ZYRTEC) 10 MG tablet Take 10 mg by mouth daily.    Yes [provider]  diclofenac Sodium (VOLTAREN) 1 % GEL Apply 4 g topically 4 (four) times daily. Patient taking differently: Apply 4 g topically 4 (four) times daily as needed (pain).  03/11/19  Yes Melvenia Beam, MD  levonorgestrel (MIRENA) 20 MCG/24HR IUD 1 each by Intrauterine route once.   Yes [provider]  Melatonin 12 MG TABS Take 12 mg by mouth at bedtime as needed (sleep).    Yes [provider]  tacrolimus (PROTOPIC) 0.1 % ointment Apply 1 application topically daily.    Yes [provider]  TURMERIC PO Take 2 capsules by  mouth daily.   Yes [provider]  HYDROcodone-acetaminophen (NORCO) 5-325 MG tablet Take 1 tablet by mouth every 6 (six) hours as needed for severe pain. Patient not taking: Reported on 02/05/2019 12/18/18   Molpus, Jenny Reichmann, MD    Allergies    Dovonex [calcipotriene] and Other  Review of Systems   Review of Systems  Constitutional: Negative for fever.  HENT: Negative for congestion.   Eyes: Negative for visual disturbance.  Respiratory: Negative for shortness of breath.   Cardiovascular: Negative for chest pain.  Gastrointestinal: Negative for abdominal pain.  Genitourinary: Negative for difficulty urinating.  Musculoskeletal: Positive for arthralgias. Negative for back pain and gait problem.  Skin: Negative for color  change, pallor, rash and wound.  Neurological: Negative for weakness, numbness and headaches.  Psychiatric/Behavioral: Negative for agitation.  All other systems reviewed and are negative.   Physical Exam Updated Vital Signs BP (!) 121/105 (BP Location: Left Arm)   Pulse 91   Temp 98.7 F (37.1 C) (Oral)   Resp 16   Ht 5\' 2"  (1.575 m)   Wt 54.4 kg   SpO2 98%   BMI 21.95 kg/m   Physical Exam Vitals and nursing note reviewed.  Constitutional:      General: She is not in acute distress.    Appearance: Normal appearance.  HENT:     Head: Normocephalic and atraumatic.     Nose: Nose normal.  Eyes:     Conjunctiva/sclera: Conjunctivae normal.     Pupils: Pupils are equal, round, and reactive to light.  Cardiovascular:     Rate and Rhythm: Normal rate and regular rhythm.     Pulses: Normal pulses.     Heart sounds: Normal heart sounds.  Pulmonary:     Effort: Pulmonary effort is normal.     Breath sounds: Normal breath sounds.  Abdominal:     General: Abdomen is flat. Bowel sounds are normal.     Tenderness: There is no abdominal tenderness. There is no guarding.  Musculoskeletal:        General: No swelling, deformity or signs of injury. Normal range of motion.     Cervical back: Normal range of motion and neck supple.     Left hip: Normal.     Left upper leg: Normal.     Right knee: Normal.     Left knee: Normal.     Right lower leg: Normal. No edema.     Left lower leg: Normal. No edema.     Right ankle: Normal.     Right Achilles Tendon: Normal.     Left ankle: Normal.     Left Achilles Tendon: Normal.     Right foot: Normal.     Left foot: Normal range of motion and normal capillary refill. Tenderness present. No swelling, deformity, bunion, Charcot foot, foot drop, prominent metatarsal heads, laceration, bony tenderness or crepitus. Normal pulse.     Comments: 3+ Dorsalis pedis and posterior tibial pulse, 3+ popliteal and femoral pulse.  Foot and leg are warm,  there is no swelling they are equal in temperature and caliber.  No warmth, no erythema. FROM intact sensation and motor.    Skin:    General: Skin is warm and dry.     Capillary Refill: Capillary refill takes less than 2 seconds.  Neurological:     General: No focal deficit present.     Mental Status: She is alert and oriented to person, place, and time.  Deep Tendon Reflexes: Reflexes normal.  Psychiatric:        Mood and Affect: Mood normal.        Behavior: Behavior normal.     ED Results / Procedures / Treatments   Labs (all labs ordered are listed, but only abnormal results are displayed) Labs Reviewed - No data to display  EKG None  Radiology DG Foot Complete Left  Result Date: 04/08/2019 CLINICAL DATA:  Anterior foot pain EXAM: LEFT FOOT - COMPLETE 3+ VIEW COMPARISON:  None. FINDINGS: There is no evidence of fracture or dislocation. There is no evidence of arthropathy or other focal bone abnormality. Soft tissues are unremarkable. IMPRESSION: Negative. Electronically Signed   By: Prudencio Pair M.D.   On: 04/08/2019 04:08    Procedures Procedures (including critical care time)  Medications Ordered in ED Medications  lidocaine (LIDODERM) 5 % 2 patch (2 patches Transdermal Patch Applied 04/08/19 0353)  acetaminophen (TYLENOL) tablet 1,000 mg (1,000 mg Oral Given 04/08/19 0350)  ketorolac (TORADOL) injection 60 mg (60 mg Intramuscular Given 04/08/19 0352)    ED Course  I have reviewed the triage vital signs and the nursing notes.  Pertinent labs & imaging results that were available during my care of the patient were reviewed by me and considered in my medical decision making (see chart for details).   Doing better post medication.  I believe this is neuropathic pain and patient is seeing a spine specialist and is getting an EMG.  I will rule the patient out for a DVT with study in vascular lab in am. There is no weakness nor numbness.  I do not believe the patient needs  an MRI.  There are no signs of arterial occlusion.    Erin Costa was evaluated in Emergency Department on 04/08/2019 for the symptoms described in the history of present illness. She was evaluated in the context of the global COVID-19 pandemic, which necessitated consideration that the patient might be at risk for infection with the SARS-CoV-2 virus that causes COVID-19. Institutional protocols and algorithms that pertain to the evaluation of patients at risk for COVID-19 are in a state of rapid change based on information released by regulatory bodies including the CDC and federal and state organizations. These policies and algorithms were followed during the patient's care in the ED.  Final Clinical Impression(s) / ED Diagnoses Return for weakness, numbness, changes in vision or speech, fevers >100.4 unrelieved by medication, shortness of breath, intractable vomiting, or diarrhea, abdominal pain, Inability to tolerate liquids or food, cough, altered mental status or any concerns. No signs of systemic illness or infection. The patient is nontoxic-appearing on exam and vital signs are within normal limits.   I have reviewed the triage vital signs and the nursing notes. Pertinent labs &imaging results that were available during my care of the patient were reviewed by me and considered in my medical decision making (see chart for details).  After history, exam, and medical workup I feel the patient has been appropriately medically screened and is safe for discharge home. Pertinent diagnoses were discussed with the patient. Patient was givenstrictreturn precautions.   Juliza Machnik, MD 04/08/19 [id number]

## 2019-04-08 NOTE — Discharge Instructions (Addendum)
Present to [facility] this am for the vascular study of the LLE

## 2019-04-08 NOTE — ED Triage Notes (Signed)
Reports L leg pain onset 1hr PTA. States pain starts in her foot and shoots up towards her hip. Denies injury.

## 2019-04-08 NOTE — Progress Notes (Signed)
Left lower extremity venous duplex exam completed.  Preliminary results can be found under CV proc under chart review.  04/08/2019 11:38 AM  Stanly Si, K., RDMS, RVT

## 2019-04-08 NOTE — ED Triage Notes (Signed)
Patient is complaining of left leg pain that has been going on for a couple of hour. It shoots for her hip to her foot.

## 2019-04-19 ENCOUNTER — Other Ambulatory Visit: Payer: Self-pay

## 2019-04-19 ENCOUNTER — Ambulatory Visit: Payer: 59 | Admitting: Neurology

## 2019-04-19 ENCOUNTER — Ambulatory Visit (INDEPENDENT_AMBULATORY_CARE_PROVIDER_SITE_OTHER): Payer: 59 | Admitting: Neurology

## 2019-04-19 DIAGNOSIS — Z0289 Encounter for other administrative examinations: Secondary | ICD-10-CM

## 2019-04-19 DIAGNOSIS — M79672 Pain in left foot: Secondary | ICD-10-CM

## 2019-04-19 DIAGNOSIS — M79602 Pain in left arm: Secondary | ICD-10-CM | POA: Diagnosis not present

## 2019-04-19 MED ORDER — TACROLIMUS 0.1 % EX OINT: 1.0000 "application " | TOPICAL_OINTMENT | Freq: Every day | CUTANEOUS | 1 refills | Status: DC

## 2019-04-19 MED ORDER — GABAPENTIN 300 MG PO CAPS: 300.0000 mg | ORAL_CAPSULE | Freq: Three times a day (TID) | ORAL | 11 refills | Status: DC | PRN

## 2019-04-19 NOTE — Progress Notes (Signed)
History: Pain in the left hand and crawling sensation on her back. Radiates to her left shoulder and down the ulnar side of the arm.Here today for emg/ncs to evaluate. MRI cervical spine 02/2019 was without spinal stenosis or foraminal narrowing. MRI brain 02/2019 was normal. We sent her to Dr. Maryjean Ka for pain injections. We reviewed the findings today of essentially normal MRI brain and cervical spine and normal emg/ncs; no etiology of symptoms found. We also discussed a new issue, she was in the ED for radiating pain right foot, was in the ED and it would radiate up the lateral leg to the thigh and top of the foot and severe shooting pain at toes. It has resolved, again unknown etiology. We discussed, I prescribed gabapentin as needed.She can come back for the leg if it recurs. Will send report to Dr. Maryjean Ka to see if any injections or other procedures may help.  I spent 15 minutes of face-to-face and non-face-to-face time with patient on the  1. Left arm pain   2. Left foot pain    diagnosis.  This included previsit chart review, lab review, study review, order entry, electronic health record documentation, patient education on the different diagnostic and therapeutic options, counseling and coordination of care, risks and benefits of management, compliance, or risk factor reduction. This does not include time spent on emg/ncs.

## 2019-04-19 NOTE — Progress Notes (Signed)
Full Name: Erin Costa Gender: Female MRN #: [id number] Date of Birth: 09/06/1975    Visit Date: 04/19/2019 13:52 Age: 44 Years Examining Physician: Leighton Ruff, MD Referring Physician: Sarina Ill, MD Height: 5 feet 2 inch    History: Left arm pain and radicular symptoms. MRI cervical spine and brain both essentially normal/unremarkable. Summary: EMG/NCS performed on the left arm. All nerves and muscles (as indicated in the following tables) were within normal limits.   Conclusion: This is a normal study of the left upper extremity. No evidence for ,mononeuropathy, polyneuropathy, radiculopathy or muscle disorder.   Sarina Ill M.D.  [facility] [address], [place], [place] [address] Tel: [phone] Fax: [phone]         [facility]    Nerve / Sites Muscle Latency Ref. Amplitude Ref. Rel Amp Segments Distance Velocity Ref. Area    ms ms mV mV %  cm m/s m/s mVms  R Median - APB     Wrist APB 3.7 ?4.4 11.7 ?4.0 100 Wrist - APB 7   48.4     Upper arm APB 7.3  11.6  99 Upper arm - Wrist 22 61 ?49 47.8  L Median - APB     Wrist APB 3.6 ?4.4 11.1 ?4.0 100 Wrist - APB 7   46.5     Upper arm APB 7.0  11.1  99.8 Upper arm - Wrist 21 60 ?49 45.5  R Ulnar - ADM     Wrist ADM 2.7 ?3.3 14.0 ?6.0 100 Wrist - ADM 7   45.5     B.Elbow ADM 6.0  13.2  94.5 B.Elbow - Wrist 20 60 ?49 45.2     A.Elbow ADM 7.6  12.7  96.2 A.Elbow - B.Elbow 10 62 ?49 44.0  L Ulnar - ADM     Wrist ADM 2.5 ?3.3 14.5 ?6.0 100 Wrist - ADM 7   47.3     B.Elbow ADM 6.0  13.1  90.3 B.Elbow - Wrist 19 55 ?49 44.4     A.Elbow ADM 7.7  13.1  100 A.Elbow - B.Elbow 10 56 ?49 46.6             SNC    Nerve / Sites Rec. Site Peak Lat Ref.  Amp Ref. Segments Distance Peak Diff Ref.    ms ms V V  cm ms ms  R Median, Ulnar - Transcarpal comparison     Median Palm Wrist 2.4 ?2.2 160 ?35 Median Palm - Wrist 8       Ulnar Palm Wrist 2.0 ?2.2 92 ?12 Ulnar Palm - Wrist 8     Median Palm - Ulnar Palm  0.4 ?0.4  L Median, Ulnar - Transcarpal comparison     Median Palm Wrist 2.1 ?2.2 165 ?35 Median Palm - Wrist 8       Ulnar Palm Wrist 2.1 ?2.2 89 ?12 Ulnar Palm - Wrist 8          Median Palm - Ulnar Palm  0.0 ?0.4  R Median - Orthodromic (Dig II, Mid palm)     Dig II Wrist 3.3 ?3.4 20 ?10 Dig II - Wrist 13    L Median - Orthodromic (Dig II, Mid palm)     Dig II Wrist 3.1 ?3.4 31 ?10 Dig II - Wrist 13    R Ulnar - Orthodromic, (Dig V, Mid palm)     Dig V Wrist 2.8 ?3.1 21 ?5 Dig V -  Wrist 11    L Ulnar - Orthodromic, (Dig V, Mid palm)     Dig V Wrist 2.9 ?3.1 22 ?5 Dig V - Wrist 24                   F  Wave    Nerve F Lat Ref.   ms ms  R Ulnar - ADM 26.7 ?32.0  L Ulnar - ADM 27.1 ?32.0         EMG Summary Table    Spontaneous MUAP Recruitment  Muscle IA Fib PSW Fasc Other Amp Dur. Poly Pattern  L. Cervical paraspinals (low) Normal None None None _______ Normal Normal Normal Normal  L. Deltoid Normal None None None _______ Normal Normal Normal Normal  L. First dorsal interosseous Normal None None None _______ Normal Normal Normal Normal  L. Flexor digitorum profundus (Ulnar) Normal None None None _______ Normal Normal Normal Normal  L. Pronator teres Normal None None None _______ Normal Normal Normal Normal  L. Opponens pollicis Normal None None None _______ Normal Normal Normal Normal

## 2019-04-19 NOTE — Patient Instructions (Signed)

## 2019-04-19 NOTE — Progress Notes (Signed)
See procedure note.

## 2019-07-26 ENCOUNTER — Other Ambulatory Visit: Payer: Self-pay

## 2019-07-26 ENCOUNTER — Emergency Department (HOSPITAL_COMMUNITY): Payer: 59

## 2019-07-26 ENCOUNTER — Emergency Department (HOSPITAL_COMMUNITY)
Admission: EM | Admit: 2019-07-26 | Discharge: 2019-07-26 | Disposition: A | Discharge status: Home / Self Care | Payer: 59 | Attending: Emergency Medicine | Admitting: Emergency Medicine

## 2019-07-26 ENCOUNTER — Encounter (HOSPITAL_COMMUNITY): Payer: Self-pay

## 2019-07-26 DIAGNOSIS — S161XXA Strain of muscle, fascia and tendon at neck level, initial encounter: Secondary | ICD-10-CM

## 2019-07-26 DIAGNOSIS — W010XXA Fall on same level from slipping, tripping and stumbling without subsequent striking against object, initial encounter: Secondary | ICD-10-CM | POA: Diagnosis not present

## 2019-07-26 DIAGNOSIS — S0083XA Contusion of other part of head, initial encounter: Secondary | ICD-10-CM | POA: Insufficient documentation

## 2019-07-26 DIAGNOSIS — Y939 Activity, unspecified: Secondary | ICD-10-CM | POA: Insufficient documentation

## 2019-07-26 DIAGNOSIS — Y999 Unspecified external cause status: Secondary | ICD-10-CM | POA: Diagnosis not present

## 2019-07-26 DIAGNOSIS — S0993XA Unspecified injury of face, initial encounter: Secondary | ICD-10-CM | POA: Diagnosis present

## 2019-07-26 DIAGNOSIS — Y929 Unspecified place or not applicable: Secondary | ICD-10-CM | POA: Diagnosis not present

## 2019-07-26 DIAGNOSIS — S6990XA Unspecified injury of unspecified wrist, hand and finger(s), initial encounter: Secondary | ICD-10-CM | POA: Diagnosis not present

## 2019-07-26 MED ORDER — ACETAMINOPHEN 325 MG PO TABS
650.0000 mg | ORAL_TABLET | Freq: Once | ORAL | Status: AC
  Administered 2019-07-26: 650 mg via ORAL
  Filled 2019-07-26: qty 2

## 2019-07-26 NOTE — ED Notes (Signed)
c-collar placed on the patient.

## 2019-07-26 NOTE — ED Triage Notes (Addendum)
Patient states husband moved some furniture into her daughter's room last night and patient did not know. patient states she tripped over an ottoman and fell flat on her face.  Patient c/o headache, right lateral neck pain. patient states she is unable to eat solid food because her teeth hurt, and facial pain.

## 2019-07-26 NOTE — Discharge Instructions (Addendum)
Take over the counter tylenol or ibuprofen as needed for pain.  Follow up with your doctor and or dentist as we discussed if not improving.

## 2019-07-26 NOTE — ED Provider Notes (Signed)
[facility] DEPT Provider Note   CSN: [id number] Arrival date & time: 07/26/19  1825     History Chief Complaint  Patient presents with  . Fall    Erin Costa is a 44 y.o. female.  HPI   Patient presents ED for evaluation of pain after a fall last night.  Patient tripped over an ottoman last night and fell forward riding on her face.  Patient also injured her wrist and she tried to brace herself.  Patient states throughout the day she has been having a headache as well as pain in the front of her face.  She also has sharp shooting pain in her neck.  Her teeth feel sore and it hurts to move her mouth.  She also has soreness on her nose.  Her right wrist is also tender.  She denies any nausea or vomiting.  No numbness or weakness.  Past Medical History:  Diagnosis Date  . Anemia    hx of   . Back pain   . Bursitis    to left hip  . GERD (gastroesophageal reflux disease)   . Headache   . Heart murmur   . Hypoglycemia   . Hypoglycemia   . Kidney stones    calcium oxalate and one other kind  . Palpitations   . Vitamin D deficiency     Patient Active Problem List   Diagnosis Date Noted  . Leukopenia 10/12/2018  . Lymphadenopathy 10/12/2018  . Radiculopathy 08/13/2017  . Migraine 06/23/2017  . Occipital neuralgia 06/23/2017  . Insomnia 02/21/2016  . Anxiety 02/21/2016  . Tension headache 02/21/2016    Past Surgical History:  Procedure Laterality Date  . CYSTOSCOPY WITH RETROGRADE PYELOGRAM, URETEROSCOPY AND STENT PLACEMENT Bilateral 05/10/2016   Procedure: CYSTOSCOPY WITH RETROGRADE PYELOGRAM, URETEROSCOPY, BASKET EXTRACTION OF STONE ON LEFT;  Surgeon: Cleon Gustin, MD;  Location: [facility];  Service: Urology;  Laterality: Bilateral;  . UTERINE FIBROID SURGERY       OB History   No obstetric history on file.     Family History  Problem Relation Age of Onset  . Stroke Mother   . Cancer Father        lung cancer  . Dementia  Maternal Aunt   . Stroke Maternal Grandmother   . Graves' disease Sister     Social History   Tobacco Use  . Smoking status: Never Smoker  . Smokeless tobacco: Never Used  Vaping Use  . Vaping Use: Never used  Substance Use Topics  . Alcohol use: Yes    Comment: wine occasionally  . Drug use: No    Home Medications Prior to Admission medications   Medication Sig Start Date End Date Taking? Authorizing Provider  cetirizine (ZYRTEC) 10 MG tablet Take 10 mg by mouth daily.     [provider]  diclofenac Sodium (VOLTAREN) 1 % GEL Apply 4 g topically 4 (four) times daily. Patient taking differently: Apply 4 g topically 4 (four) times daily as needed (pain).  03/11/19   Melvenia Beam, MD  gabapentin (NEURONTIN) 300 MG capsule Take 1 capsule (300 mg total) by mouth 3 (three) times daily as needed. For nerve pain. 04/19/19   Melvenia Beam, MD  HYDROcodone-acetaminophen (NORCO) 5-325 MG tablet Take 1 tablet by mouth every 6 (six) hours as needed for severe pain. Patient not taking: Reported on 02/05/2019 12/18/18   Molpus, Jenny Reichmann, MD  levonorgestrel (MIRENA) 20 MCG/24HR IUD 1 each by Intrauterine route  once.    [provider]  lidocaine (LIDODERM) 5 % Place 1 patch onto the skin daily. Remove & Discard patch within 12 hours or as directed by MD 04/08/19   Randal Buba, April, MD  Melatonin 12 MG TABS Take 12 mg by mouth at bedtime as needed (sleep).     [provider]  naproxen (NAPROSYN) 375 MG tablet Take 1 tablet (375 mg total) by mouth 2 (two) times daily. 04/08/19   Palumbo, April, MD  tacrolimus (PROTOPIC) 0.1 % ointment Apply 1 application topically daily. 04/19/19   Melvenia Beam, MD  TURMERIC PO Take 2 capsules by mouth daily.    [provider]    Allergies    Dovonex [calcipotriene] and Other  Review of Systems   Review of Systems  All other systems reviewed and are negative.   Physical Exam Updated Vital Signs BP 117/67 (BP Location:  Left Arm)   Pulse 84   Temp 99 F (37.2 C) (Oral)   Resp 18   Ht 1.575 m (5\' 2" )   Wt 55.7 kg   SpO2 99%   BMI 22.48 kg/m   Physical Exam Vitals and nursing note reviewed.  Constitutional:      General: She is not in acute distress.    Appearance: She is well-developed.  HENT:     Head: Normocephalic.     Comments: Tenderness palpation forehead bilateral maxillary region, bilateral mandibular region as well as the nose, no dental malocclusion, no lip or gum injury noted, no obvious dental fracture    Right Ear: External ear normal.     Left Ear: External ear normal.  Eyes:     General: No scleral icterus.       Right eye: No discharge.        Left eye: No discharge.     Conjunctiva/sclera: Conjunctivae normal.  Neck:     Trachea: No tracheal deviation.  Cardiovascular:     Rate and Rhythm: Normal rate.  Pulmonary:     Effort: Pulmonary effort is normal. No respiratory distress.     Breath sounds: No stridor.  Abdominal:     General: There is no distension.  Musculoskeletal:        General: Tenderness present. No swelling or deformity.     Right wrist: Tenderness present. No swelling or deformity. Decreased range of motion.     Cervical back: Neck supple. Tenderness present. Decreased range of motion.  Skin:    General: Skin is warm and dry.     Findings: No rash.  Neurological:     Mental Status: She is alert.     Cranial Nerves: Cranial nerve deficit: no gross deficits.     ED Results / Procedures / Treatments   Labs (all labs ordered are listed, but only abnormal results are displayed) Labs Reviewed - No data to display  EKG None  Radiology DG Wrist Complete Right  Result Date: 07/26/2019 CLINICAL DATA:  Right wrist pain after fall at home. EXAM: RIGHT WRIST - COMPLETE 3+ VIEW COMPARISON:  None. FINDINGS: There is no evidence of fracture or dislocation. Minor degenerative spurring at the scaphotrapezium articulation. There is no other evidence of  arthropathy or focal bone abnormality. Soft tissues are unremarkable. IMPRESSION: No fracture or subluxation of the right wrist. Electronically Signed   By: Keith Rake M.D.   On: 07/26/2019 21:39   CT Head Wo Contrast  Result Date: 07/26/2019 CLINICAL DATA:  44 year old female status post trip and fall  onto face at home. Headache and pain. EXAM: CT HEAD WITHOUT CONTRAST TECHNIQUE: Contiguous axial images were obtained from the base of the skull through the vertex without intravenous contrast. COMPARISON:  Brain MRI 02/17/2019. FINDINGS: Brain: No midline shift, ventriculomegaly, mass effect, evidence of mass lesion, intracranial hemorrhage or evidence of cortically based acute infarction. Gray-white matter differentiation is within normal limits throughout the brain. Vascular: No suspicious intracranial vascular hyperdensity. Skull: No fracture identified. Sinuses/Orbits: Small retention cyst in the posterior right ethmoid air cell is stable since February. Other Visualized paranasal sinuses and mastoids are clear. Other: No orbit or scalp soft tissue injury identified, note also face CT today reported separately. IMPRESSION: 1. No acute traumatic injury identified. Normal noncontrast CT appearance of the brain. 2. See also Face CT today reported separately. Electronically Signed   By: Genevie Ann M.D.   On: 07/26/2019 21:43   CT Cervical Spine Wo Contrast  Result Date: 07/26/2019 CLINICAL DATA:  43 year old female status post trip and fall onto face at home. Headache and pain. EXAM: CT CERVICAL SPINE WITHOUT CONTRAST TECHNIQUE: Multidetector CT imaging of the cervical spine was performed without intravenous contrast. Multiplanar CT image reconstructions were also generated. COMPARISON:  Face and head CT today. FINDINGS: Alignment: Straightening of cervical lordosis. Cervicothoracic junction alignment is within normal limits. Bilateral posterior element alignment is within normal limits. Skull base and  vertebrae: Visualized skull base is intact. No atlanto-occipital dissociation. No acute osseous abnormality identified. Soft tissues and spinal canal: No prevertebral fluid or swelling. No visible canal hematoma. Negative noncontrast visible neck soft tissues. Disc levels: Chronic C1-C2 degeneration at the odontoid with small degenerative subchondral cyst. Disc and endplate degeneration appears maximal at C5-C6, although no spinal stenosis is suspected. Upper chest: Visible upper thoracic levels appear intact. Negative lung apices. IMPRESSION: 1. No acute traumatic injury identified in the cervical spine. 2. Some chronic degenerative changes at C1-C2 and C5-C6. Electronically Signed   By: Genevie Ann M.D.   On: 07/26/2019 21:51   CT Maxillofacial Wo Contrast  Result Date: 07/26/2019 CLINICAL DATA:  44 year old female status post trip and fall onto face at home. Headache and pain. EXAM: CT MAXILLOFACIAL WITHOUT CONTRAST TECHNIQUE: Multidetector CT imaging of the maxillofacial structures was performed. Multiplanar CT image reconstructions were also generated. COMPARISON:  Head CT today reported separately. FINDINGS: Osseous: Mandible intact. No acute dental finding. Maxilla, zygoma, and nasal bones intact. Central skull base intact. Orbits: Intact bilateral orbital walls. Globes and intraorbital soft tissues appear normal. Sinuses: Clear aside from chronic opacification of a posterior right ethmoid air cell. Visible tympanic cavities and mastoids are clear. Soft tissues: Negative visible noncontrast larynx, pharynx, parapharyngeal spaces, retropharyngeal space, sublingual space, submandibular spaces, masticator and parotid spaces. No superficial soft tissue injury identified. No evidence of upper cervical lymphadenopathy. Limited intracranial: Negative, and also reported separately today on head CT. IMPRESSION: Negative.  No acute traumatic injury identified. Electronically Signed   By: Genevie Ann M.D.   On: 07/26/2019  21:48    Procedures Procedures (including critical care time)  Medications Ordered in ED Medications  acetaminophen (TYLENOL) tablet 650 mg (650 mg Oral Given 07/26/19 2140)    ED Course  I have reviewed the triage vital signs and the nursing notes.  Pertinent labs & imaging results that were available during my care of the patient were reviewed by me and considered in my medical decision making (see chart for details).  Clinical Course as of Jul 25 2209  Mon Jul 26, 2019  2201 CT scan of the head maxillofacial and cervical spine regions are all normal.  Wrist x-ray is negative   [JK]    Clinical Course User Index [JK] Dorie Rank, MD   MDM Rules/Calculators/A&P                          No evidence of serious injury associated with the fall.  Consistent with soft tissue injury.  Explained findings to patient and warning signs that should prompt return to the ED.  Consider follow up with dentist and or PCP  Final Clinical Impression(s) / ED Diagnoses Final diagnoses:  Contusion of face, initial encounter  Strain of neck muscle, initial encounter    Rx / DC Orders ED Discharge Orders    None       Dorie Rank, MD 07/26/19 2212

## 2020-01-17 ENCOUNTER — Ambulatory Visit: Payer: 59 | Admitting: Neurology
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
## 2020-01-18 ENCOUNTER — Encounter: Payer: Self-pay | Admitting: Neurology

## 2020-01-18 ENCOUNTER — Ambulatory Visit: Payer: Self-pay | Admitting: Neurology

## 2020-01-18 ENCOUNTER — Telehealth: Payer: Self-pay

## 2020-01-18 ENCOUNTER — Ambulatory Visit: Payer: 59 | Admitting: Neurology

## 2020-01-18 ENCOUNTER — Encounter: Payer: Self-pay | Admitting: *Deleted

## 2020-01-18 VITALS — BP 121/73 | HR 82 | Ht 62.5 in | Wt 131.0 lb

## 2020-01-18 DIAGNOSIS — M5417 Radiculopathy, lumbosacral region: Secondary | ICD-10-CM | POA: Diagnosis not present

## 2020-01-18 DIAGNOSIS — R202 Paresthesia of skin: Secondary | ICD-10-CM | POA: Diagnosis not present

## 2020-01-18 DIAGNOSIS — G5730 Lesion of lateral popliteal nerve, unspecified lower limb: Secondary | ICD-10-CM | POA: Diagnosis not present
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
## 2020-01-18 NOTE — Patient Instructions (Signed)
XRay left leg     Common Peroneal Nerve Entrapment  Common peroneal nerve entrapment is a condition that can make it hard to lift a foot. The condition results from pressure on a nerve in the lower leg called the common peroneal nerve. Your common peroneal nerve provides feeling to your outer lower leg and foot. It also supplies the muscles that move your foot and toes upward and outward. What are the causes? This condition may be caused by:  Sitting cross-legged, squatting, or kneeling for long periods of time.  A hard, direct hit to the side of the lower leg.  Swelling from a knee injury.  A break (fracture) in one of the lower leg bones.  Wearing a boot or cast that ends just below the knee.  A growth or cyst near the nerve. What increases the risk? This condition is more likely to develop in people who play:  Contact sports, such as football or hockey.  Sports where you wear high and stiff boots, such as skiing. What are the signs or symptoms? Symptoms of this condition include:  Trouble lifting your foot up (foot drop).  Tripping often.  Your foot hitting the ground harder than normal as you walk.  Numbness, tingling, or pain in the outside of the knee, outside of the lower leg, and top of the foot.  Sensitivity to pressure on the front or side of the leg. How is this diagnosed? This condition may be diagnosed based on:  Your symptoms.  Your medical history.  A physical exam.  Tests, such as: ? An X-ray to check the bones of your knee and leg. ? MRI to check tendons that attach to the side of your knee. ? An ultrasound to check for a growth or cyst. ? An electromyogram (EMG) to check your nerves. During your physical exam, your health care provider will check for numbness in your leg and test the strength of your lower leg muscles. He or she may tap the side of your lower leg to see if that causes tingling. How is this treated? Treatment for this condition  may include:  Avoiding activities that make symptoms worse.  Using a brace to hold up your foot and toes.  Taking anti-inflammatory pain medicines to relieve swelling and lessen pain.  Having medicines injected into your ankle joint to lessen pain and swelling.  Doing exercises to help you regain or maintain movement (physical therapy).  Surgery to take pressure off the nerve. This may be needed if there is no improvement after 2-3 months or if there is a growth pushing on the nerve.  Returning gradually to full activity. Follow these instructions at home: If you have a brace:  Wear it as told by your health care provider. Remove it only as told by your health care provider.  Loosen the brace if your toes tingle, become numb, or turn cold and blue.  Keep the brace clean.  If the brace is not waterproof: ? Do not let it get wet. ? Cover it with a watertight covering when you take a bath or a shower.  Ask your health care provider when it is safe to drive with a brace on your foot. Activity  Return to your normal activities as told by your health care provider. Ask your health care provider what activities are safe for you.  Do not do any activities that make pain or swelling worse.  Do exercises as told by your health care provider. General  instructions  Take over-the-counter and prescription medicines only as told by your health care provider.  Do not put your full weight on your knee until your health care provider says you can. Use crutches as directed by your health care provider.  Keep all follow-up visits as told by your health care provider. This is important. How is this prevented?  Wear supportive footwear that is appropriate for your athletic activity.  Avoid athletic activities that cause ankle pain or swelling.  Wear protective padding over your lower legs when playing contact sports.  Make sure your boots do not put extra pressure on the area just below  your knees.  Do not sit cross-legged for long periods of time. Contact a health care provider if:  Your symptoms do not get better in 2-3 months.  The weakness or numbness in your leg or foot gets worse. Summary  Common peroneal nerve entrapment is a condition that results from pressure on a nerve in the lower leg called the common peroneal nerve.  This condition may be caused by a hard hit, swelling, a fracture, or a cyst in the lower leg.  Treatment may include rest, a brace, medicines, and physical therapy. Sometimes surgery is needed.  Do not do any activities that make pain or swelling worse. This information is not intended to replace advice given to you by your health care provider. Make sure you discuss any questions you have with your health care provider. Document Revised: 11/10/2017 Document Reviewed: 11/10/2017 Elsevier Patient Education  2020 [employer].

## 2020-01-18 NOTE — Telephone Encounter (Signed)
Pt came back after her appt. Pt asked for a stand up desk and has another question for Dr. Lucia Gaskins after her appt. Please call.

## 2020-01-18 NOTE — Telephone Encounter (Signed)
Letter written. Letter reviewed and signed by Dr Lucia Gaskins. It was placed at front desk for pt pickup. I called the pt back and LVM advising her of this and also asked for call back with her question for Dr Lucia Gaskins. Left office number in message.

## 2020-01-23 LAB — VITAMIN B6: Vitamin B6: 15.9 ug/L (ref 2.0–32.8)

## 2020-01-23 LAB — VITAMIN B1: Thiamine: 83.4 nmol/L (ref 66.5–200.0)

## 2020-01-23 LAB — B12 AND FOLATE PANEL
Folate: 9.6 ng/mL (ref >3.0)
Vitamin B-12: 693 pg/mL (ref 232–1245)

## 2020-01-23 LAB — HEMOGLOBIN A1C
Est. average glucose Bld gHb Est-mCnc: 117 mg/dL
Hgb A1c MFr Bld: 5.7 % — ABNORMAL HIGH (ref 4.8–5.6)

## 2020-01-23 LAB — METHYLMALONIC ACID, SERUM: Methylmalonic Acid: 100 nmol/L (ref 0–378)

## 2020-01-25 ENCOUNTER — Telehealth: Payer: Self-pay | Admitting: *Deleted

## 2020-01-25 NOTE — Telephone Encounter (Signed)
Spoke with patient and informed labs show she is pre-diabetic but otherwise labs are normal. Advised she have PCP FU on that lab. Patient verbalized understanding, appreciation.

## 2020-02-23 ENCOUNTER — Other Ambulatory Visit: Payer: Self-pay | Admitting: Family Medicine

## 2020-02-23 DIAGNOSIS — N200 Calculus of kidney: Secondary | ICD-10-CM

## 2020-02-28 ENCOUNTER — Telehealth: Payer: Self-pay | Admitting: *Deleted

## 2020-02-28 ENCOUNTER — Ambulatory Visit
Admission: RE | Admit: 2020-02-28 | Discharge: 2020-02-28 | Disposition: A | Discharge status: Home / Self Care | Payer: 59 | Source: Ambulatory Visit | Attending: Neurology | Admitting: Neurology

## 2020-02-28 ENCOUNTER — Other Ambulatory Visit: Payer: Self-pay

## 2020-02-28 DIAGNOSIS — G5730 Lesion of lateral popliteal nerve, unspecified lower limb: Secondary | ICD-10-CM

## 2020-02-28 DIAGNOSIS — R202 Paresthesia of skin: Secondary | ICD-10-CM

## 2020-02-28 NOTE — Telephone Encounter (Signed)
Spoke with pt and advised her of unremarkable knee xray, nothing to explain symptoms. She verbalized understanding anda appreciation for the call.

## 2020-02-28 NOTE — Telephone Encounter (Signed)
-----   Message from Melvenia Beam, MD sent at 02/28/2020  4:57 PM EST ----- Xray was unremarkable, nothing to explain her symptoms thanks

## 2020-03-01 ENCOUNTER — Ambulatory Visit
Admission: RE | Admit: 2020-03-01 | Discharge: 2020-03-01 | Disposition: A | Discharge status: Home / Self Care | Payer: 59 | Source: Ambulatory Visit | Attending: Family Medicine | Admitting: Family Medicine

## 2020-03-01 ENCOUNTER — Other Ambulatory Visit: Payer: Self-pay

## 2020-03-01 DIAGNOSIS — N200 Calculus of kidney: Secondary | ICD-10-CM

## 2020-03-08 ENCOUNTER — Other Ambulatory Visit: Payer: 59

## 2020-03-13 ENCOUNTER — Other Ambulatory Visit: Payer: 59

## 2020-04-20 ENCOUNTER — Telehealth: Payer: Self-pay | Admitting: Neurology

## 2020-04-20 NOTE — Telephone Encounter (Signed)
Mailed Pt letter left on file to be picked up dated 1.4.2022. Erin Costa

## 2020-07-09 IMAGING — CT CT ABD-PELV W/ CM
1 of 3 series · 13 of 32 positions shown, 19 images · IV contrast (APPLIED)
Comparison: 05/13/2016 stone study

CLINICAL DATA: Right lower quadrant pain worsening for the last
year. Constipation.

EXAM:
CT ABDOMEN AND PELVIS WITH CONTRAST
TECHNIQUE: Multidetector CT imaging of the abdomen and pelvis was performed
using the standard protocol following bolus administration of
intravenous contrast.
CONTRAST:  100mL [id number] IOPAMIDOL ([id number]) INJECTION 61%

[Series 2: abd/pelvis w/cm · axial · 0.61mm/px · z∈[-333,+17]mm · 13 of 82 slices shown, 19 images]
[im 6/82  soft-tissue]
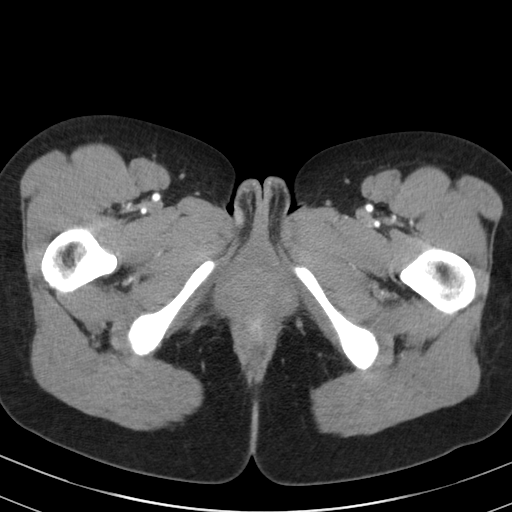
[im 6/82  bone]
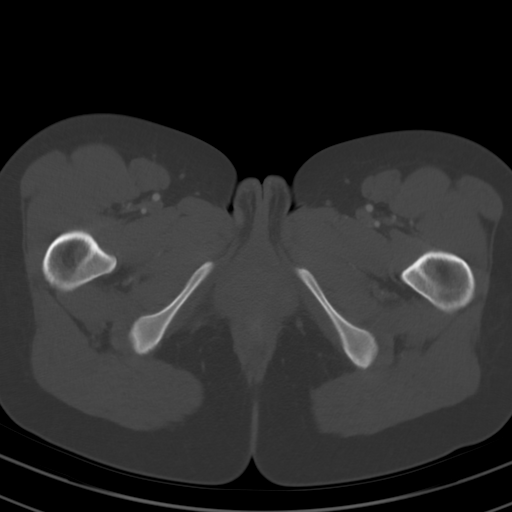
[im 11/82  soft-tissue]
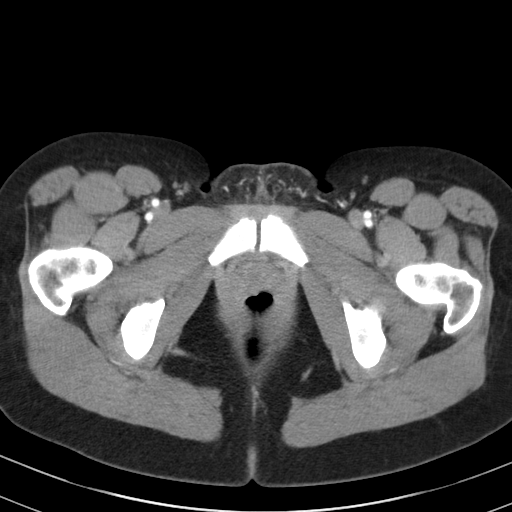
[im 17/82  soft-tissue]
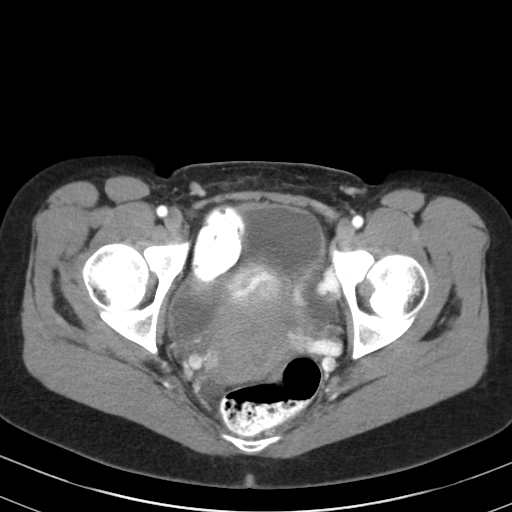
[im 22/82  soft-tissue]
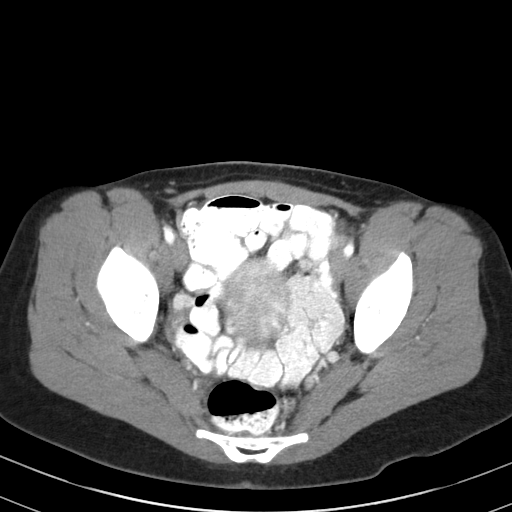
[im 28/82  soft-tissue]
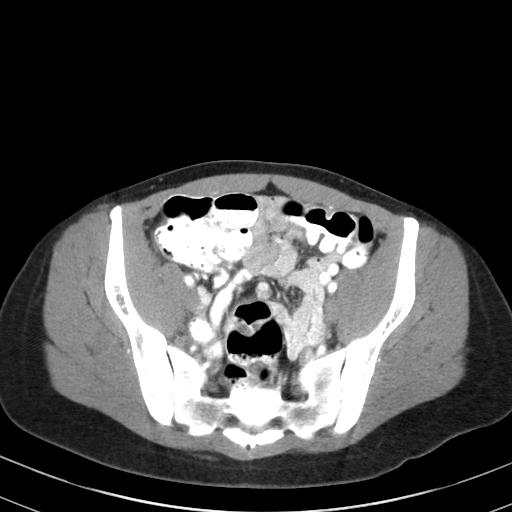
[im 33/82  soft-tissue]
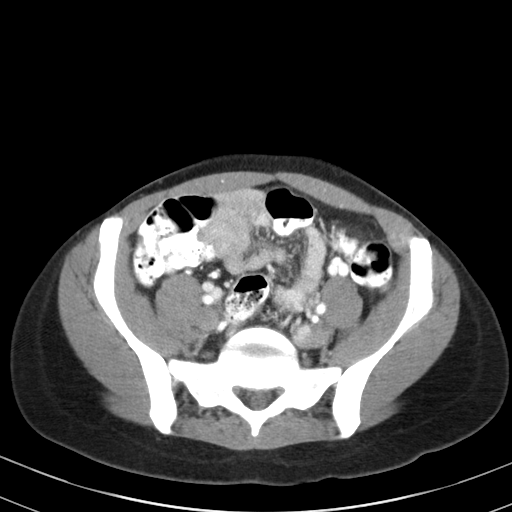
[im 44/82  soft-tissue]
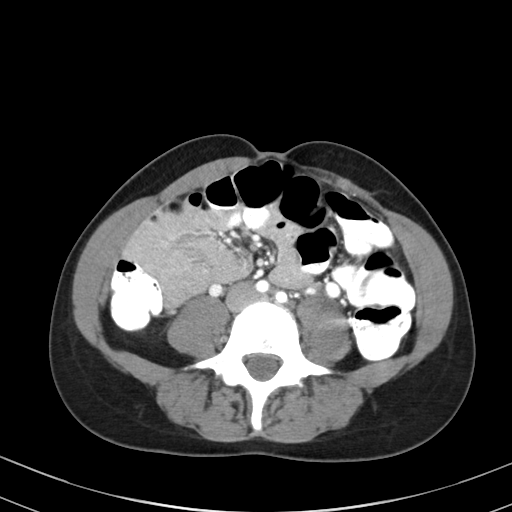
[im 49/82  soft-tissue]
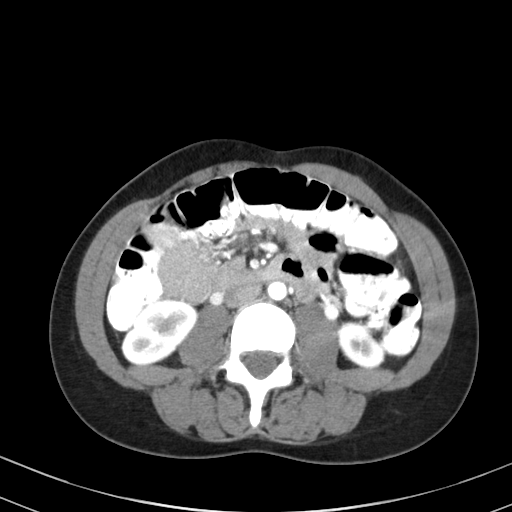
[im 55/82  soft-tissue]
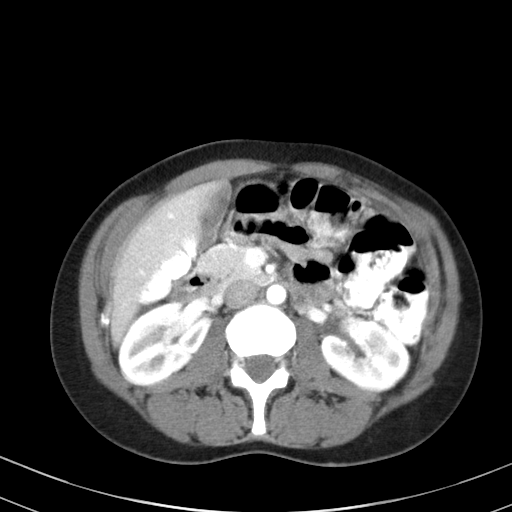
[im 55/82  bone]
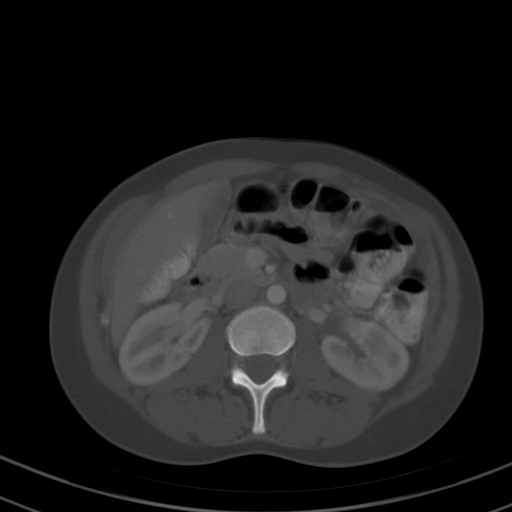
[im 60/82  soft-tissue]
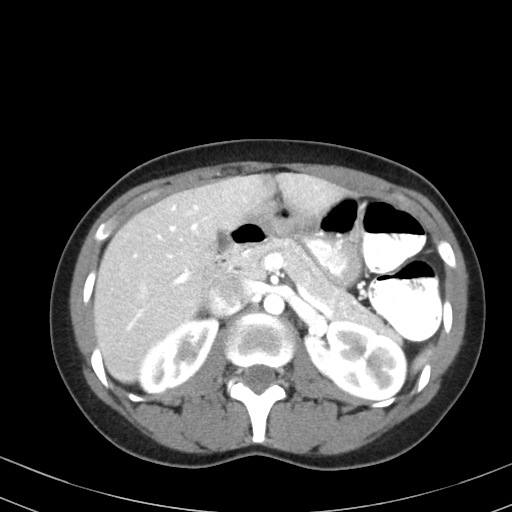
[im 60/82  lung]
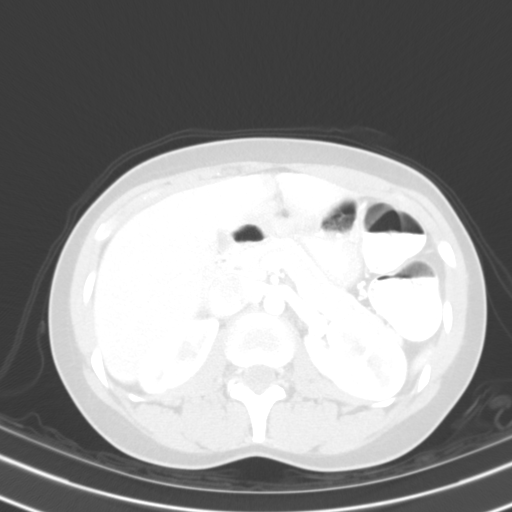
[im 65/82  soft-tissue]
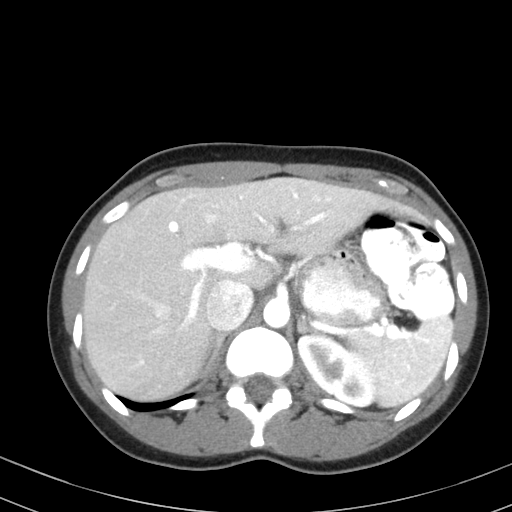
[im 65/82  lung]
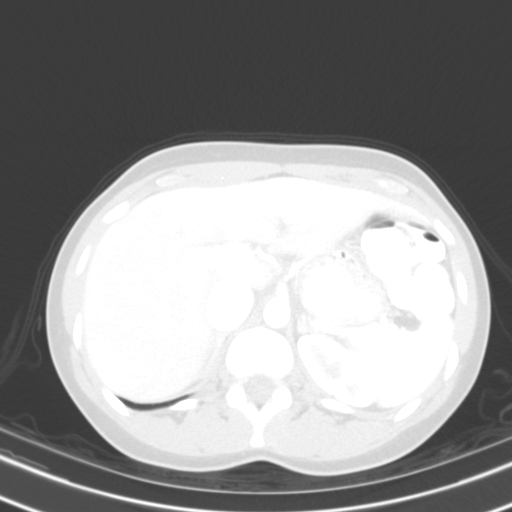
[im 71/82  soft-tissue]
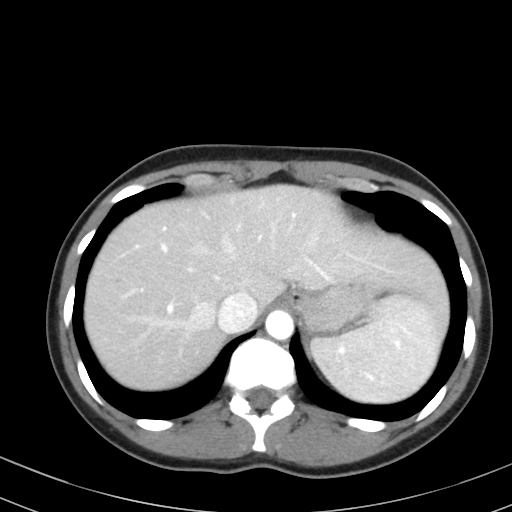
[im 71/82  lung]
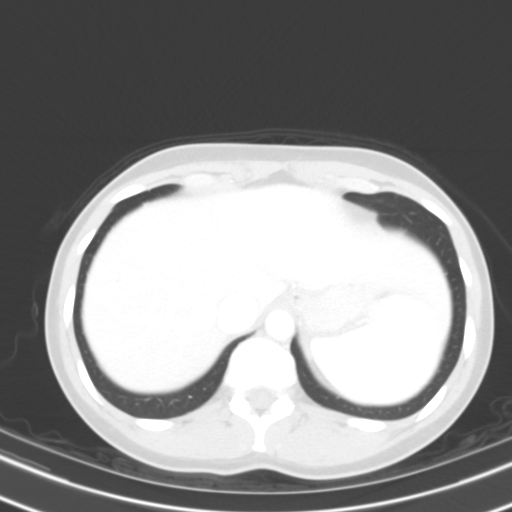
[im 76/82  soft-tissue]
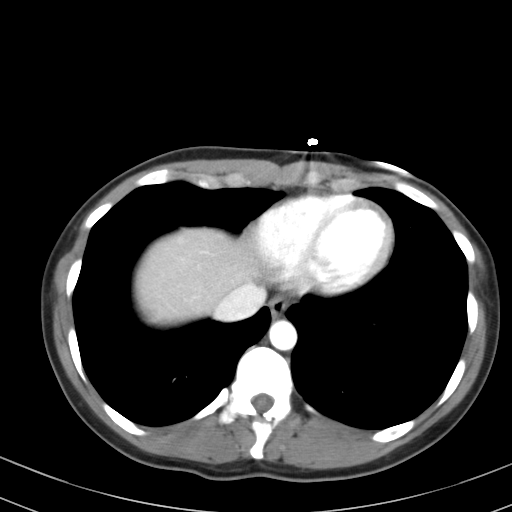
[im 76/82  lung]
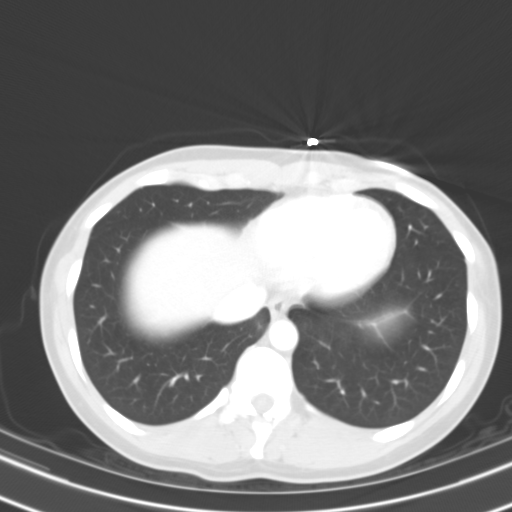

[13 of 32 positions shown; findings below may reference images not displayed]

FINDINGS: Lower chest: Clear lung bases. Normal heart size without pericardial
or pleural effusion.

Hepatobiliary: Scattered too small to characterize liver lesions.
Variant lateral segment left liver lobe extending in the left upper
quadrant. Normal gallbladder, without biliary ductal dilatation.

Pancreas: Normal, without mass or ductal dilatation.

Spleen: Normal in size, without focal abnormality.

Adrenals/Urinary Tract: Normal adrenal glands. Punctate interpolar
right renal collecting system calculus, only apparent on coronal
reformats. Normal post-contrast appearance of the left kidney. The
punctate stones within the kidneys on the prior stone study are not
readily apparent. Normal urinary bladder.

Stomach/Bowel: Normal stomach, without wall thickening. Normal colon
and terminal ileum. Normal appendix on 54/2. Normal small bowel.

Vascular/Lymphatic: Normal caliber of the aorta and branch vessels.
No abdominopelvic adenopathy.

Reproductive: Intrauterine device. Dominant follicle or cyst in the
right ovary at 2.2 cm.

Other: Trace free pelvic fluid is likely physiologic.

Musculoskeletal: No acute osseous abnormality.
IMPRESSION: 1. No specific explanation for right lower quadrant pain. Normal
appendix.
2. Right nephrolithiasis.
3. 2.2 cm right ovarian lesion is likely a dominant follicle or
cyst.

## 2020-10-25 ENCOUNTER — Other Ambulatory Visit: Payer: Self-pay | Admitting: Family Medicine

## 2020-10-25 DIAGNOSIS — E559 Vitamin D deficiency, unspecified: Secondary | ICD-10-CM

## 2021-02-12 ENCOUNTER — Ambulatory Visit: Payer: Self-pay

## 2021-02-12 ENCOUNTER — Other Ambulatory Visit: Payer: Self-pay | Admitting: Occupational Medicine

## 2021-02-12 ENCOUNTER — Other Ambulatory Visit: Payer: Self-pay

## 2021-02-12 DIAGNOSIS — M25571 Pain in right ankle and joints of right foot: Secondary | ICD-10-CM

## 2021-02-13 ENCOUNTER — Other Ambulatory Visit: Payer: 59

## 2021-04-26 ENCOUNTER — Other Ambulatory Visit: Payer: Self-pay | Admitting: Family Medicine

## 2021-04-26 DIAGNOSIS — N200 Calculus of kidney: Secondary | ICD-10-CM

## 2021-06-05 ENCOUNTER — Telehealth: Payer: Self-pay | Admitting: Neurology

## 2021-06-05 NOTE — Telephone Encounter (Signed)
Pt said having right hand weakness for the last several months. Not able to grip objects, no pain. Would like a call from the nurse.

## 2021-06-05 NOTE — Telephone Encounter (Addendum)
I called pt back and relayed that if new problem she would have you see pcp for evaluation.  R hand weakness and involuntary eye movements (are new) she would have you see opthamology to evaluate initially for eye movements.   The R hand weakness she has seen her pcp and she will have him refer back to [place].  She did not see [facility] but asking if refer back to them for this.  This would be a new problem, so if her pcp wants to refer to them (not sure if  they are able to do [facility]) [facility] maybe option for [facility] or [facility].   She will call me back since she had another person holding on her other line.

## 2021-06-05 NOTE — Telephone Encounter (Signed)
I called pt she is asking for an appointment, she had over the last several months R hand weakness/grip strength weakened.  No trauma's. She also mentioned that she is having L eye involuntary movement.  Let me know if to make appt with you.  Pt stated that Dr. Jaynee Eagles has seen previously for paresthesia's, sharp shooting pains.  Are you ok to see pt?

## 2021-06-06 NOTE — Telephone Encounter (Signed)
I called pt and LMVM for her that we are happy to see her if received another referral from her pcp. If needs EMG/[place] may need to refer you out to other providers that do that and they most likely would see you first for evaluation then another appt for procedure.  Pcp referring you to them may save you appt.  But let us know if you have anymore questions.

## 2022-03-12 ENCOUNTER — Institutional Professional Consult (permissible substitution): Payer: 59 | Admitting: Neurology

## 2022-03-12 ENCOUNTER — Encounter: Payer: Self-pay | Admitting: Neurology

## 2022-04-17 ENCOUNTER — Institutional Professional Consult (permissible substitution): Payer: 59 | Admitting: Neurology

## 2022-04-17 ENCOUNTER — Encounter: Payer: Self-pay | Admitting: Neurology

## 2022-04-17 ENCOUNTER — Ambulatory Visit: Payer: 59 | Admitting: Neurology

## 2022-04-17 VITALS — BP 127/79 | HR 95 | Ht 62.5 in | Wt 126.0 lb

## 2022-04-17 DIAGNOSIS — G5601 Carpal tunnel syndrome, right upper limb: Secondary | ICD-10-CM
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
## 2022-04-17 NOTE — Patient Instructions (Addendum)
EMG/NCS  Address: [address], [place], [place] [address] Hours:  [place] Born soon ? 9?AM Phone: [phone]  Carpal Tunnel Syndrome  Carpal tunnel syndrome is a condition that causes pain, numbness, and weakness in your hand and fingers. The carpal tunnel is a narrow area located on the palm side of your wrist. Repeated wrist motion or certain diseases may cause swelling within the tunnel. This swelling pinches the main nerve in the wrist. The main nerve in the wrist is called the median nerve. What are the causes? This condition may be caused by: Repeated and forceful wrist and hand motions. Wrist injuries. Arthritis. A cyst or tumor in the carpal tunnel. Fluid buildup during pregnancy. Use of tools that vibrate. Sometimes the cause of this condition is not known. What increases the risk? The following factors may make you more likely to develop this condition: Having a job that requires you to repeatedly or forcefully move your wrist or hand or requires you to use tools that vibrate. This may include jobs that involve using computers, working on an [employer], or working with [employer] such as Pension scheme manager. Being a woman. Having certain conditions, such as: Diabetes. Obesity. An underactive thyroid (hypothyroidism). Kidney failure. Rheumatoid arthritis. What are the signs or symptoms? Symptoms of this condition include: A tingling feeling in your fingers, especially in your thumb, index, and middle fingers. Tingling or numbness in your hand. An aching feeling in your entire arm, especially when your wrist and elbow are bent for a long time. Wrist pain that goes up your arm to your shoulder. Pain that goes down into your palm or fingers. A weak feeling in your hands. You may have trouble grabbing and holding items. Your symptoms may feel worse during the night. How is this diagnosed? This condition is diagnosed with a medical history and physical exam. You may  also have tests, including: Electromyogram (EMG). This test measures electrical signals sent by your nerves into the muscles. Nerve conduction study. This test measures how well electrical signals pass through your nerves. Imaging tests, such as X-rays, ultrasound, and MRI. These tests check for possible causes of your condition. How is this treated? This condition may be treated with: Lifestyle changes. It is important to stop or change the activity that caused your condition. Doing exercise and activities to strengthen and stretch your muscles and tendons (physical therapy). Making lifestyle changes to help with your condition and learning how to do your daily activities safely (occupational therapy). Medicines for pain and inflammation. This may include medicine that is injected into your wrist. A wrist splint or brace. Surgery. Follow these instructions at home: If you have a splint or brace: Wear the splint or brace as told by your health care provider. Remove it only as told by your health care provider. Loosen the splint or brace if your fingers tingle, become numb, or turn cold and blue. Keep the splint or brace clean. If the splint or brace is not waterproof: Do not let it get wet. Cover it with a watertight covering when you take a bath or shower. Managing pain, stiffness, and swelling  Electromyoneurogram Electromyoneurogram is a test to check how well your muscles and nerves are working. This procedure includes the combined use of electromyogram (EMG) and nerve conduction study (NCS). EMG is used to evaluate muscles and the nerves that control those muscles. NCS, which is also called electroneurogram, measures how well your nerves conduct electricity. The procedures should be done  together to check if your muscles and nerves are healthy. If the results of the tests are abnormal, this may indicate disease or injury, such as a neuromuscular disease or peripheral nerve damage. Tell  a health care provider about: Any allergies you have. All medicines you are taking, including vitamins, herbs, eye drops, creams, and over-the-counter medicines. Any bleeding problems you have. Any surgeries you have had. Any medical conditions you have. What are the risks? Generally, this is a safe procedure. However, problems may occur, including: Bleeding or bruising. Infection where the electrodes were inserted. What happens before the test? Medicines Take all of your usually prescribed medications before this testing is performed. Do not stop your blood thinners unless advised by your prescribing physician. General instructions Your health care provider may ask you to warm the limb that will be checked with warm water, hot pack, or wrapping the limb in a blanket. Do not use lotions or creams on the same day that you will be having the procedure. What happens during the test? For EMG  Your health care provider will ask you to stay in a position so that the muscle being studied can be accessed. You will be sitting or lying down. You may be given a medicine to numb the area (local anesthetic) and the skin will be disinfected. A very thin needle that has an electrode will be inserted into your muscle, one muscle at a time. Typically, multiple muscles are evaluated during a single study. Another small electrode will be placed on your skin near the muscle. Your health care provider will ask you to continue to remain still. The electrodes will record the electrical activity of your muscles. You may see this on a monitor or hear it in the room. After your muscles have been studied at rest, your health care provider will ask you to contract or flex your muscles. The electrodes will record the electrical activity of your muscles. Your health care provider will remove the electrodes and the electrode needle when the procedure is finished. The procedure may vary among health care providers and  hospitals. For NCS  An electrode that records your nerve activity (recording electrode) will be placed on your skin by the muscle that is being studied. An electrode that is used as a reference (reference electrode) will be placed near the recording electrode. A paste or gel will be applied to your skin between the recording electrode and the reference electrode. Your nerve will be stimulated with a mild shock. The speed of the nerves and strength of response is recorded by the electrodes. Your health care provider will remove the electrodes and the gel when the procedure is finished. The procedure may vary among health care providers and hospitals. What can I expect after the test? It is up to you to get your test results. Ask your health care provider, or the department that is doing the test, when your results will be ready. Your health care provider may: Give you medicines for any pain. Monitor the insertion sites to make sure that bleeding stops. You should be able to drive yourself to and from the test. Discomfort can persist for a few hours after the test, but should be better the next day. Contact a health care provider if: You have swelling, redness, or drainage at any of the insertion sites. Summary Electromyoneurogram is a test to check how well your muscles and nerves are working. If the results of the tests are abnormal, this may indicate  disease or injury. This is a safe procedure. However, problems may occur, such as bleeding and infection. Your health care provider will do two tests to complete this procedure. One checks your muscles (EMG) and another checks your nerves (NCS). It is up to you to get your test results. Ask your health care provider, or the department that is doing the test, when your results will be ready. This information is not intended to replace advice given to you by your health care provider. Make sure you discuss any questions you have with your health  care provider. Document Revised: 09/13/2020 Document Reviewed: 08/13/2020 Elsevier Patient Education  [place].  If directed, put ice on the painful area. To do this: If you have a removeable splint or brace, remove it as told by your health care provider. Put ice in a plastic bag. Place a towel between your skin and the bag or between the splint or brace and the bag. Leave the ice on for 20 minutes, 2-3 times a day. Do not fall asleep with the cold pack on your skin. Remove the ice if your skin turns bright red. This is very important. If you cannot feel pain, heat, or cold, you have a greater risk of damage to the area. Move your fingers often to reduce stiffness and swelling. General instructions Take over-the-counter and prescription medicines only as told by your health care provider. Rest your wrist and hand from any activity that may be causing your pain. If your condition is work related, talk with your employer about changes that can be made, such as getting a wrist pad to use while typing. Do any exercises as told by your health care provider, physical therapist, or occupational therapist. Keep all follow-up visits. This is important. Contact a health care provider if: You have new symptoms. Your pain is not controlled with medicines. Your symptoms get worse. Get help right away if: You have severe numbness or tingling in your wrist or hand. Summary Carpal tunnel syndrome is a condition that causes pain, numbness, and weakness in your hand and fingers. It is usually caused by repeated wrist motions. Lifestyle changes and medicines are used to treat carpal tunnel syndrome. Surgery may be recommended. Follow your health care provider's instructions about wearing a splint, resting from activity, keeping follow-up visits, and calling for help. This information is not intended to replace advice given to you by your health care provider. Make sure you discuss any questions  you have with your health care provider. Document Revised: 05/13/2019 Document Reviewed: 05/13/2019 Elsevier Patient Education  [place].

## 2022-04-29 ENCOUNTER — Other Ambulatory Visit: Payer: Self-pay | Admitting: Obstetrics and Gynecology

## 2022-04-29 DIAGNOSIS — R921 Mammographic calcification found on diagnostic imaging of breast: Secondary | ICD-10-CM

## 2022-05-02 ENCOUNTER — Ambulatory Visit (INDEPENDENT_AMBULATORY_CARE_PROVIDER_SITE_OTHER): Payer: 59 | Admitting: Neurology

## 2022-05-02 DIAGNOSIS — G5601 Carpal tunnel syndrome, right upper limb: Secondary | ICD-10-CM

## 2022-05-02 NOTE — Patient Instructions (Signed)

## 2022-05-05 NOTE — Progress Notes (Signed)
      Full Name: Erin Costa Gender: Female MRN #: [id number] Date of Birth: 01/20/1975    Visit Date: 05/02/2022 14:33 Age: 47 Years Examining Physician: Dr. Timi Reeser Referring Physician: Dr. Roshunda Keir Height: 5 feet 2 inch  History: Right hand pain and paresthesias  Summary:   Nerve Conduction Studies were performed on the bilateral upper extremities.  The right Median 2nd Digit orthodromic sensory nerve showed prolonged distal peak latency (3.6 ms, N<3.4)  The right median/ulnar (palm) comparison nerve showed prolonged distal peak latency (Median Palm, 2.4 ms, N<2.2) and abnormal peak latency difference (Median Palm-Ulnar Palm, 0.7 ms, N<0.4) with a relative median delay.    All remaining nerves and muscles (as indicated in the following tables) were within normal limits.   All muscles (as indicated in the following tables) were within normal limits.     Conclusion: There is electrophysiologic evidence of mild right Carpal Tunnel Syndrome.  No suggestion of polyneuropathy  or radiculopathy.      ------------------------------- Phillp Dolores, M.D.  [facility] [address], [address] [place], [place] [address] Tel: [phone] Fax: [phone]  Verbal informed consent was obtained from the patient, patient was informed of potential risk of procedure, including bruising, bleeding, hematoma formation, infection, muscle weakness, muscle pain, numbness, among others.        MNC    Nerve / Sites Muscle Latency Ref. Amplitude Ref. Rel Amp Segments Distance Velocity Ref. Area    ms ms mV mV %  cm m/s m/s mVms  R Median - APB     Wrist APB 3.9 ?4.4 9.4 ?4.0 100 Wrist - APB 7   42.4     Upper arm APB 7.9  9.7  103 Upper arm - Wrist 22.6 57 ?49 43.8  L Median - APB     Wrist APB 3.5 ?4.4 10.7 ?4.0 100 Wrist - APB 7   48.8     Upper arm APB 7.5  10.9  103 Upper arm - Wrist 23 58 ?49 47.4  R Ulnar - ADM     Wrist ADM 3.0 ?3.3 11.0 ?6.0 100 Wrist -  ADM 7   41.6     B.Elbow ADM 4.8  9.7  88.7 B.Elbow - Wrist 11 61 ?49 39.8     A.Elbow ADM 7.1  10.4  107 A.Elbow - B.Elbow 15 63 ?49 43.4  L Ulnar - ADM     Wrist ADM 2.7 ?3.3 10.3 ?6.0 100 Wrist - ADM 7   38.1     B.Elbow ADM 4.4  10.6  103 B.Elbow - Wrist 11 64 ?49 39.1     A.Elbow ADM 7.2  10.1  95.1 A.Elbow - B.Elbow 16 58 ?49 39.0             SNC    Nerve / Sites Rec. Site Peak Lat Ref.  Amp Ref. Segments Distance Peak Diff Ref.    ms ms V V  cm ms ms  R Median, Ulnar - Transcarpal comparison     Median Palm Wrist 2.4 ?2.2 69 ?35 Median Palm - Wrist 8       Ulnar Palm Wrist 1.8 ?2.2 22 ?12 Ulnar Palm - Wrist 8          Median Palm - Ulnar Palm  0.7 ?0.4  L Median, Ulnar - Transcarpal comparison     Median Palm Wrist 2.2 ?2.2 66 ?35 Median Palm - Wrist 8       Ulnar   Palm Wrist 2.0 ?2.2 56 ?12 Ulnar Palm - Wrist 8          Median Palm - Ulnar Palm  0.1 ?0.4  R Median - Orthodromic (Dig II, Mid palm)     Dig II Wrist 3.6 ?3.4 17 ?10 Dig II - Wrist 13    L Median - Orthodromic (Dig II, Mid palm)     Dig II Wrist 3.1 ?3.4 25 ?10 Dig II - Wrist 13    R Ulnar - Orthodromic, (Dig V, Mid palm)     Dig V Wrist 3.0 ?3.1 23 ?5 Dig V - Wrist 11    L Ulnar - Orthodromic, (Dig V, Mid palm)     Dig V Wrist 2.9 ?3.1 12 ?5 Dig V - Wrist 63                   F  Wave    Nerve F Lat Ref.   ms ms  R Ulnar - ADM 24.9 ?32.0  L Ulnar - ADM 25.2 ?32.0         EMG Summary Table    Spontaneous MUAP Recruitment  Muscle IA Fib PSW Fasc Other Amp Dur. Poly Pattern  R. Deltoid Normal None None None _______ Normal Normal Normal Normal  R. Cervical paraspinals (low) Normal None None None _______ Normal Normal Normal Normal  R. Triceps brachii Normal None None None _______ Normal Normal Normal Normal  R. Pronator teres Normal None None None _______ Normal Normal Normal Normal  R. First dorsal interosseous Normal None None None _______ Normal Normal Normal Normal  R. Opponens pollicis Normal None  None None _______ Normal Normal Normal Normal

## 2022-05-05 NOTE — Progress Notes (Signed)
Patient here for emg/ncs. She has CTS. Explained as below,  reviewed these diagrams with her, pathophys, symptoms, conservative and other measured: she would like to be referred to hand center. Referral placed for CTS as discussed with patient.    Discussed: Conclusion: There is electrophysiologic evidence of mild right Carpal Tunnel Syndrome.  No suggestion of polyneuropathy  or radiculopathy.    Orders Placed This Encounter  Procedures   Ambulatory referral to Hand Surgery     I spent over 10 minutes of face-to-face and non-face-to-face time with patient on the  1. Carpal tunnel syndrome of right wrist    diagnosis.  This included previsit chart review, lab review, study review, order entry, electronic health record documentation, patient education on the different diagnostic and therapeutic options, counseling and coordination of care, risks and benefits of management, compliance, or risk factor reduction. This does not include time spent on emg/ncs.        Carpal Tunnel Syndrome

## 2022-05-05 NOTE — Procedures (Signed)
Full Name: Shulamit Donofrio Gender: Female MRN #: [id number] Date of Birth: 1975-05-05    Visit Date: 05/02/2022 14:33 Age: 47 Years Examining Physician: Dr. Naomie Dean Referring Physician: Dr. Naomie Dean Height: 5 feet 2 inch  History: Right hand pain and paresthesias  Summary:   Nerve Conduction Studies were performed on the bilateral upper extremities.  The right Median 2nd Digit orthodromic sensory nerve showed prolonged distal peak latency (3.6 ms, N<3.4)  The right median/ulnar (palm) comparison nerve showed prolonged distal peak latency (Median Palm, 2.4 ms, N<2.2) and abnormal peak latency difference (Median Palm-Ulnar Palm, 0.7 ms, N<0.4) with a relative median delay.    All remaining nerves and muscles (as indicated in the following tables) were within normal limits.   All muscles (as indicated in the following tables) were within normal limits.     Conclusion: There is electrophysiologic evidence of mild right Carpal Tunnel Syndrome.  No suggestion of polyneuropathy  or radiculopathy.      ------------------------------- Naomie Dean, M.D.  [facility] [address], [address] [place], [place] [address] Tel: [phone] Fax: [phone]  Verbal informed consent was obtained from the patient, patient was informed of potential risk of procedure, including bruising, bleeding, hematoma formation, infection, muscle weakness, muscle pain, numbness, among others.        MNC    Nerve / Sites Muscle Latency Ref. Amplitude Ref. Rel Amp Segments Distance Velocity Ref. Area    ms ms mV mV %  cm m/s m/s mVms  R Median - APB     Wrist APB 3.9 ?4.4 9.4 ?4.0 100 Wrist - APB 7   42.4     Upper arm APB 7.9  9.7  103 Upper arm - Wrist 22.6 57 ?49 43.8  L Median - APB     Wrist APB 3.5 ?4.4 10.7 ?4.0 100 Wrist - APB 7   48.8     Upper arm APB 7.5  10.9  103 Upper arm - Wrist 23 58 ?49 47.4  R Ulnar - ADM     Wrist ADM 3.0 ?3.3 11.0 ?6.0 100 Wrist -  ADM 7   41.6     B.Elbow ADM 4.8  9.7  88.7 B.Elbow - Wrist 11 61 ?49 39.8     A.Elbow ADM 7.1  10.4  107 A.Elbow - B.Elbow 15 63 ?49 43.4  L Ulnar - ADM     Wrist ADM 2.7 ?3.3 10.3 ?6.0 100 Wrist - ADM 7   38.1     B.Elbow ADM 4.4  10.6  103 B.Elbow - Wrist 11 64 ?49 39.1     A.Elbow ADM 7.2  10.1  95.1 A.Elbow - B.Elbow 16 58 ?49 39.0             SNC    Nerve / Sites Rec. Site Peak Lat Ref.  Amp Ref. Segments Distance Peak Diff Ref.    ms ms V V  cm ms ms  R Median, Ulnar - Transcarpal comparison     Median Palm Wrist 2.4 ?2.2 69 ?35 Median Palm - Wrist 8       Ulnar Palm Wrist 1.8 ?2.2 22 ?12 Ulnar Palm - Wrist 8          Median Palm - Ulnar Palm  0.7 ?0.4  L Median, Ulnar - Transcarpal comparison     Median Palm Wrist 2.2 ?2.2 66 ?35 Median Palm - Wrist 8       Ulnar  Palm Wrist 2.0 ?2.2 56 ?12 Ulnar Palm - Wrist 8          Median Palm - Ulnar Palm  0.1 ?0.4  R Median - Orthodromic (Dig II, Mid palm)     Dig II Wrist 3.6 ?3.4 17 ?10 Dig II - Wrist 13    L Median - Orthodromic (Dig II, Mid palm)     Dig II Wrist 3.1 ?3.4 25 ?10 Dig II - Wrist 13    R Ulnar - Orthodromic, (Dig V, Mid palm)     Dig V Wrist 3.0 ?3.1 23 ?5 Dig V - Wrist 11    L Ulnar - Orthodromic, (Dig V, Mid palm)     Dig V Wrist 2.9 ?3.1 12 ?5 Dig V - Wrist 63                   F  Wave    Nerve F Lat Ref.   ms ms  R Ulnar - ADM 24.9 ?32.0  L Ulnar - ADM 25.2 ?32.0         EMG Summary Table    Spontaneous MUAP Recruitment  Muscle IA Fib PSW Fasc Other Amp Dur. Poly Pattern  R. Deltoid Normal None None None _______ Normal Normal Normal Normal  R. Cervical paraspinals (low) Normal None None None _______ Normal Normal Normal Normal  R. Triceps brachii Normal None None None _______ Normal Normal Normal Normal  R. Pronator teres Normal None None None _______ Normal Normal Normal Normal  R. First dorsal interosseous Normal None None None _______ Normal Normal Normal Normal  R. Opponens pollicis Normal None  None None _______ Normal Normal Normal Normal

## 2022-05-16 ENCOUNTER — Ambulatory Visit
Admission: RE | Admit: 2022-05-16 | Discharge: 2022-05-16 | Disposition: A | Discharge status: Home / Self Care | Payer: 59 | Source: Ambulatory Visit | Attending: Obstetrics and Gynecology | Admitting: Obstetrics and Gynecology

## 2022-05-16 DIAGNOSIS — R921 Mammographic calcification found on diagnostic imaging of breast: Secondary | ICD-10-CM

## 2022-05-16 MED ORDER — GADOPICLENOL 0.5 MMOL/ML IV SOLN
6.0000 mL | Freq: Once | INTRAVENOUS | Status: AC | PRN
  Administered 2022-05-16: 6 mL via INTRAVENOUS

## 2022-07-17 ENCOUNTER — Other Ambulatory Visit: Payer: Self-pay | Admitting: Neurology

## 2022-07-17 DIAGNOSIS — G5601 Carpal tunnel syndrome, right upper limb: Secondary | ICD-10-CM

## 2022-07-17 MED ORDER — PREDNISONE 20 MG PO TABS: 60.0000 mg | ORAL_TABLET | Freq: Every day | ORAL | 3 refills | Status: DC

## 2022-07-17 MED ORDER — GABAPENTIN 300 MG PO CAPS: 300.0000 mg | ORAL_CAPSULE | Freq: Three times a day (TID) | ORAL | 11 refills | Status: DC | PRN

## 2022-09-04 ENCOUNTER — Ambulatory Visit: Payer: 59 | Admitting: Neurology

## 2022-09-20 ENCOUNTER — Other Ambulatory Visit: Payer: Self-pay

## 2022-09-20 ENCOUNTER — Encounter (HOSPITAL_COMMUNITY): Payer: Self-pay | Admitting: Emergency Medicine

## 2022-09-20 ENCOUNTER — Emergency Department (HOSPITAL_COMMUNITY)
Admission: EM | Admit: 2022-09-20 | Discharge: 2022-09-21 | Disposition: A | Discharge status: Home / Self Care | Payer: 59 | Attending: Emergency Medicine | Admitting: Emergency Medicine

## 2022-09-20 DIAGNOSIS — S0993XA Unspecified injury of face, initial encounter: Secondary | ICD-10-CM | POA: Diagnosis present

## 2022-09-20 DIAGNOSIS — S0083XA Contusion of other part of head, initial encounter: Secondary | ICD-10-CM | POA: Diagnosis not present

## 2022-09-20 DIAGNOSIS — M79602 Pain in left arm: Secondary | ICD-10-CM | POA: Insufficient documentation

## 2022-09-20 DIAGNOSIS — Z87442 Personal history of urinary calculi: Secondary | ICD-10-CM | POA: Diagnosis not present

## 2022-09-20 DIAGNOSIS — W01198A Fall on same level from slipping, tripping and stumbling with subsequent striking against other object, initial encounter: Secondary | ICD-10-CM | POA: Diagnosis not present

## 2022-09-20 DIAGNOSIS — M79605 Pain in left leg: Secondary | ICD-10-CM | POA: Insufficient documentation

## 2022-09-20 DIAGNOSIS — W19XXXA Unspecified fall, initial encounter: Secondary | ICD-10-CM

## 2022-09-20 NOTE — ED Triage Notes (Signed)
Pt BIB EMS for fall out of a food truck, reports that she fell against the back of another truck before hitting the ground, pt in C-Collar placed by EMS, pt co pain on the left side of the body from head to foot

## 2022-09-21 ENCOUNTER — Emergency Department (HOSPITAL_COMMUNITY): Payer: 59

## 2022-09-21 MED ORDER — CYCLOBENZAPRINE HCL 5 MG PO TABS: 5.0000 mg | ORAL_TABLET | Freq: Two times a day (BID) | ORAL | 0 refills | Status: DC | PRN

## 2022-09-21 MED ORDER — MORPHINE SULFATE (PF) 4 MG/ML IV SOLN
4.0000 mg | Freq: Once | INTRAVENOUS | Status: AC
  Administered 2022-09-21: 4 mg via INTRAVENOUS
  Filled 2022-09-21: qty 1

## 2022-09-21 MED ORDER — KETOROLAC TROMETHAMINE 30 MG/ML IJ SOLN
30.0000 mg | Freq: Once | INTRAMUSCULAR | Status: AC
  Administered 2022-09-21: 30 mg via INTRAVENOUS
  Filled 2022-09-21: qty 1

## 2022-09-21 MED ORDER — ONDANSETRON HCL 4 MG/2ML IJ SOLN
4.0000 mg | Freq: Once | INTRAMUSCULAR | Status: AC
  Administered 2022-09-21: 4 mg via INTRAVENOUS
  Filled 2022-09-21: qty 2

## 2022-09-21 MED ORDER — IBUPROFEN 600 MG PO TABS: 600.0000 mg | ORAL_TABLET | Freq: Four times a day (QID) | ORAL | 0 refills | Status: AC | PRN

## 2022-09-21 NOTE — ED Provider Notes (Signed)
[place] EMERGENCY DEPARTMENT AT [facility] Provider Note   CSN: [id number] Arrival date & time: 09/20/22  2344     History  Chief Complaint  Patient presents with   Erin Costa is a 47 y.o. female.  HPI     This is a 47 year old female who presents following a fall.  Patient reports that she was standing on the back of food dropped.  She was stepping down but tried to avoid missing her son who is seated below her.  She subsequently fell mostly on her left side.  She did hit her head but did not lose consciousness.  No nausea or vomiting.  She is complaining of headache, facial pain, pain of the left arm and left leg.  She was not ambulatory on scene.  She is not on any blood thinners.  Home Medications Prior to Admission medications   Medication Sig Start Date End Date Taking? Authorizing Provider  cyclobenzaprine (FLEXERIL) 5 MG tablet Take 1 tablet (5 mg total) by mouth 2 (two) times daily as needed for muscle spasms. 09/21/22  Yes Lyanne Kates, Mayer Masker, MD  ibuprofen (ADVIL) 600 MG tablet Take 1 tablet (600 mg total) by mouth every 6 (six) hours as needed. 09/21/22  Yes Chrystian Ressler, Mayer Masker, MD  predniSONE (DELTASONE) 20 MG tablet Take 3 tablets (60 mg total) by mouth daily. 07/17/22   Anson Fret, MD  ACETAMINOPHEN PO Take by mouth as needed.    [provider]  cetirizine (ZYRTEC) 10 MG tablet Take 10 mg by mouth daily.    [provider]  diclofenac Sodium (VOLTAREN) 1 % GEL Apply 4 g topically 4 (four) times daily. Patient not taking: Reported on 04/17/2022 03/11/19   Anson Fret, MD  gabapentin (NEURONTIN) 300 MG capsule Take 1 capsule (300 mg total) by mouth 3 (three) times daily as needed. For nerve pain. 07/17/22   Anson Fret, MD  IBUPROFEN PO Take by mouth as needed.    [provider]  levonorgestrel (MIRENA) 20 MCG/24HR IUD 1 each by Intrauterine route once.    [provider]  tacrolimus (PROTOPIC) 0.1 %  ointment Apply 1 application topically daily. 04/19/19   Anson Fret, MD  TURMERIC PO Take 2 capsules by mouth daily. Patient not taking: Reported on 01/18/2020    [provider]      Allergies    Dovonex [calcipotriene] and Other    Review of Systems   Review of Systems  Constitutional:  Negative for fever.  Respiratory:  Negative for shortness of breath.   Cardiovascular:  Negative for chest pain.  Musculoskeletal:        Shoulder, knee pain  Neurological:  Positive for headaches.  All other systems reviewed and are negative.   Physical Exam Updated Vital Signs BP 127/77   Pulse 89   Temp 98.1 F (36.7 C) (Oral)   Resp 16   SpO2 100%  Physical Exam Vitals and nursing note reviewed.  Constitutional:      Appearance: She is well-developed. She is not ill-appearing.  HENT:     Head: Normocephalic.     Comments: Tenderness to palpation left zygoma, slight swelling noted    Mouth/Throat:     Mouth: Mucous membranes are moist.  Eyes:     Extraocular Movements: Extraocular movements intact.     Pupils: Pupils are equal, round, and reactive to light.  Neck:     Comments: C-collar in place,  no midline C-spine tenderness, step-off, deformity Cardiovascular:     Rate and Rhythm: Normal rate and regular rhythm.     Heart sounds: Normal heart sounds.  Pulmonary:     Effort: Pulmonary effort is normal. No respiratory distress.     Breath sounds: No wheezing.  Abdominal:     General: Bowel sounds are normal.     Palpations: Abdomen is soft.     Tenderness: There is no abdominal tenderness. There is no guarding or rebound.  Musculoskeletal:        General: No deformity. Normal range of motion.     Comments: Pain with range of motion of the left knee and left shoulder, no obvious deformities or dislocations  Skin:    General: Skin is warm and dry.  Neurological:     Mental Status: She is alert and oriented to person, place, and time.  Psychiatric:        Mood  and Affect: Mood normal.     ED Results / Procedures / Treatments   Labs (all labs ordered are listed, but only abnormal results are displayed) Labs Reviewed - No data to display  EKG None  Radiology DG Knee Complete 4 Views Left  Result Date: 09/21/2022 CLINICAL DATA:  Fall EXAM: LEFT KNEE - COMPLETE 4+ VIEW COMPARISON:  Left knee x-ray 02/28/2020 FINDINGS: No evidence of fracture, dislocation, or joint effusion. No evidence of arthropathy or other focal bone abnormality. Soft tissues are unremarkable. IMPRESSION: Negative. Electronically Signed   By: Darliss Cheney M.D.   On: 09/21/2022 01:20   DG Shoulder Left  Result Date: 09/21/2022 CLINICAL DATA:  Fall, left shoulder pain EXAM: LEFT SHOULDER - 2+ VIEW COMPARISON:  None Available. FINDINGS: There is no evidence of fracture or dislocation. There is no evidence of arthropathy or other focal bone abnormality. Soft tissues are unremarkable. IMPRESSION: Negative. Electronically Signed   By: Helyn Numbers M.D.   On: 09/21/2022 01:15   DG Wrist Complete Left  Result Date: 09/21/2022 CLINICAL DATA:  Fall from height, left wrist pain EXAM: LEFT WRIST - COMPLETE 3+ VIEW COMPARISON:  None Available. FINDINGS: There is no evidence of fracture or dislocation. There is no evidence of arthropathy or other focal bone abnormality. Soft tissues are unremarkable. IMPRESSION: Negative. Electronically Signed   By: Helyn Numbers M.D.   On: 09/21/2022 01:14   CT Maxillofacial Wo Contrast  Result Date: 09/21/2022 CLINICAL DATA:  Status post trauma. EXAM: CT MAXILLOFACIAL WITHOUT CONTRAST TECHNIQUE: Multidetector CT imaging of the maxillofacial structures was performed. Multiplanar CT image reconstructions were also generated. RADIATION DOSE REDUCTION: This exam was performed according to the departmental dose-optimization program which includes automated exposure control, adjustment of the mA and/or kV according to patient size and/or use of iterative  reconstruction technique. COMPARISON:  None Available. FINDINGS: Osseous: No fracture or mandibular dislocation. No destructive process. Orbits: Negative. No traumatic or inflammatory finding. Sinuses: Clear. Soft tissues: There is mild to moderate severity left-sided facial and left periorbital soft tissue swelling. Limited intracranial: No significant or unexpected finding. IMPRESSION: 1. Mild to moderate severity left-sided facial and left periorbital soft tissue swelling. 2. No evidence of acute facial bone fracture. Electronically Signed   By: Aram Candela M.D.   On: 09/21/2022 01:10   CT Cervical Spine Wo Contrast  Result Date: 09/21/2022 CLINICAL DATA:  Status post trauma. EXAM: CT CERVICAL SPINE WITHOUT CONTRAST TECHNIQUE: Multidetector CT imaging of the cervical spine was performed without intravenous contrast. Multiplanar CT image reconstructions were  also generated. RADIATION DOSE REDUCTION: This exam was performed according to the departmental dose-optimization program which includes automated exposure control, adjustment of the mA and/or kV according to patient size and/or use of iterative reconstruction technique. COMPARISON:  July 26, 2019 FINDINGS: Alignment: Normal. Skull base and vertebrae: No acute fracture. Chronic and degenerative changes are seen along the tip of the dens and the adjacent portion of the anterior arch of C1. Soft tissues and spinal canal: No prevertebral fluid or swelling. No visible canal hematoma. Disc levels: Mild endplate sclerosis, anterior osteophyte formation and posterior bony spurring are seen at the level of C5-C6. Mild intervertebral disc space narrowing is seen at C5-C6. Normal, bilateral multilevel facet joints are noted. Upper chest: Negative. Other: None. IMPRESSION: 1. No acute fracture or subluxation of the cervical spine. 2. Mild degenerative changes at the level of C5-C6. Electronically Signed   By: Aram Candela M.D.   On: 09/21/2022 01:09   CT  Head Wo Contrast  Result Date: 09/21/2022 CLINICAL DATA:  Status post trauma. EXAM: CT HEAD WITHOUT CONTRAST TECHNIQUE: Contiguous axial images were obtained from the base of the skull through the vertex without intravenous contrast. RADIATION DOSE REDUCTION: This exam was performed according to the departmental dose-optimization program which includes automated exposure control, adjustment of the mA and/or kV according to patient size and/or use of iterative reconstruction technique. COMPARISON:  None Available. FINDINGS: Brain: No evidence of acute infarction, hemorrhage, hydrocephalus, extra-axial collection or mass lesion/mass effect. Vascular: No hyperdense vessel or unexpected calcification. Skull: Normal. Negative for fracture or focal lesion. Sinuses/Orbits: No acute finding. Other: There is mild to moderate severity left-sided facial soft tissue swelling. IMPRESSION: 1. No acute intracranial abnormality. 2. Mild to moderate severity left-sided facial soft tissue swelling. Electronically Signed   By: Aram Candela M.D.   On: 09/21/2022 01:06    Procedures Procedures    Medications Ordered in ED Medications  morphine (PF) 4 MG/ML injection 4 mg (4 mg Intravenous Given 09/21/22 0034)  ondansetron (ZOFRAN) injection 4 mg (4 mg Intravenous Given 09/21/22 0035)  ketorolac (TORADOL) 30 MG/ML injection 30 mg (30 mg Intravenous Given 09/21/22 0237)    ED Course/ Medical Decision Making/ A&P                                 Medical Decision Making Amount and/or Complexity of Data Reviewed Radiology: ordered.  Risk Prescription drug management.   This patient presents to the ED for concern of fall, this involves an extensive number of treatment options, and is a complaint that carries with it a high risk of complications and morbidity.  I considered the following differential and admission for this acute, potentially life threatening condition.  The differential diagnosis includes head injury,  neck injury, long bone injury or fracture  MDM:    This is a 47 year old who presents following a fall.  Fall was from height of several feet.  She reports falling directly on her left side.  She is complaining of headache, facial pain, left arm and leg pain.  .  She is nontoxic he is nontoxic and vital signs are reassuring.  ABCs intact.  CT head, neck, and plain films were obtained.  These are all largely unremarkable without fracture.  She does have evidence of a facial contusion.  C-spine was cleared by Nexus criteria.  Will discharge with anti-inflammatories and muscle relaxants.  (Labs, imaging, consults)  Labs: I Ordered,  and personally interpreted labs.  The pertinent results include: None  Imaging Studies ordered: I ordered imaging studies including CT head, cervical spine, x-rays I independently visualized and interpreted imaging. I agree with the radiologist interpretation  Additional history obtained from family at bedside.  External records from outside source obtained and reviewed including prior evaluations  Cardiac Monitoring: The patient was not maintained on a cardiac monitor.  If on the cardiac monitor, I personally viewed and interpreted the cardiac monitored which showed an underlying rhythm of: N/A I  Reevaluation: After the interventions noted above, I reevaluated the patient and found that they have :improved  Social Determinants of Health:  lives independently  Disposition: Discharge  Co morbidities that complicate the patient evaluation  Past Medical History:  Diagnosis Date   Anemia    hx of    Back pain    Bursitis    to left hip   GERD (gastroesophageal reflux disease)    Headache    Heart murmur    Hypoglycemia    Hypoglycemia    Kidney stones    calcium oxalate and one other kind   Palpitations    Vitamin D deficiency      Medicines Meds ordered this encounter  Medications   morphine (PF) 4 MG/ML injection 4 mg   ondansetron (ZOFRAN)  injection 4 mg   ketorolac (TORADOL) 30 MG/ML injection 30 mg   ibuprofen (ADVIL) 600 MG tablet    Sig: Take 1 tablet (600 mg total) by mouth every 6 (six) hours as needed.    Dispense:  30 tablet    Refill:  0   cyclobenzaprine (FLEXERIL) 5 MG tablet    Sig: Take 1 tablet (5 mg total) by mouth 2 (two) times daily as needed for muscle spasms.    Dispense:  10 tablet    Refill:  0    I have reviewed the patients home medicines and have made adjustments as needed  Problem List / ED Course: Problem List Items Addressed This Visit   None Visit Diagnoses     Fall, initial encounter    -  Primary   Contusion of face, initial encounter                       Final Clinical Impression(s) / ED Diagnoses Final diagnoses:  Fall, initial encounter  Contusion of face, initial encounter    Rx / DC Orders ED Discharge Orders          Ordered    ibuprofen (ADVIL) 600 MG tablet  Every 6 hours PRN        09/21/22 0252    cyclobenzaprine (FLEXERIL) 5 MG tablet  2 times daily PRN        09/21/22 0252              Shon Baton, MD 09/21/22 [phone]

## 2022-09-21 NOTE — Discharge Instructions (Addendum)
You were seen today after a fall.  You have evidence of a facial contusion.  You will likely be very sore.  Take Flexeril and ibuprofen or Tylenol.  Do not drive while taking Flexeril.

## 2022-09-23 ENCOUNTER — Encounter: Payer: Self-pay | Admitting: Neurology

## 2022-09-23 ENCOUNTER — Ambulatory Visit: Payer: 59 | Admitting: Neurology

## 2022-09-23 VITALS — BP 113/76 | HR 81 | Ht 62.0 in | Wt 129.0 lb

## 2022-09-23 DIAGNOSIS — S199XXA Unspecified injury of neck, initial encounter: Secondary | ICD-10-CM

## 2022-09-23 DIAGNOSIS — M48062 Spinal stenosis, lumbar region with neurogenic claudication: Secondary | ICD-10-CM

## 2022-09-23 DIAGNOSIS — H5712 Ocular pain, left eye: Secondary | ICD-10-CM

## 2022-09-23 DIAGNOSIS — M5410 Radiculopathy, site unspecified: Secondary | ICD-10-CM

## 2022-09-23 DIAGNOSIS — M791 Myalgia, unspecified site: Secondary | ICD-10-CM

## 2022-09-23 DIAGNOSIS — R200 Anesthesia of skin: Secondary | ICD-10-CM

## 2022-09-23 DIAGNOSIS — R29898 Other symptoms and signs involving the musculoskeletal system: Secondary | ICD-10-CM

## 2022-09-23 DIAGNOSIS — G4700 Insomnia, unspecified: Secondary | ICD-10-CM

## 2022-09-23 DIAGNOSIS — S0592XA Unspecified injury of left eye and orbit, initial encounter: Secondary | ICD-10-CM

## 2022-09-23 DIAGNOSIS — S0990XA Unspecified injury of head, initial encounter: Secondary | ICD-10-CM

## 2022-09-23 DIAGNOSIS — G8929 Other chronic pain: Secondary | ICD-10-CM

## 2022-09-23 DIAGNOSIS — R4189 Other symptoms and signs involving cognitive functions and awareness: Secondary | ICD-10-CM

## 2022-09-23 DIAGNOSIS — R519 Headache, unspecified: Secondary | ICD-10-CM

## 2022-09-23 DIAGNOSIS — M5442 Lumbago with sciatica, left side: Secondary | ICD-10-CM

## 2022-09-23 DIAGNOSIS — G4719 Other hypersomnia: Secondary | ICD-10-CM

## 2022-09-23 MED ORDER — CYCLOBENZAPRINE HCL 10 MG PO TABS
10.0000 mg | ORAL_TABLET | Freq: Every evening | ORAL | 3 refills | Status: DC | PRN
Start: 2022-09-23 — End: 2023-03-24

## 2022-09-23 MED ORDER — HYDROCODONE-ACETAMINOPHEN 5-325 MG PO TABS: 1.0000 | ORAL_TABLET | Freq: Four times a day (QID) | ORAL | 0 refills | Status: DC | PRN
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
## 2022-09-27 ENCOUNTER — Encounter: Payer: Self-pay | Admitting: Neurology
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
## 2022-09-30 ENCOUNTER — Telehealth: Payer: Self-pay | Admitting: Neurology

## 2022-09-30 DIAGNOSIS — R4189 Other symptoms and signs involving cognitive functions and awareness: Secondary | ICD-10-CM

## 2022-09-30 NOTE — Telephone Encounter (Signed)
I'll place a new one to be on the safe side.

## 2022-09-30 NOTE — Telephone Encounter (Signed)
You can try [facility] please, thank you! Do I need to place another referral or can you send the one already ordered?

## 2022-09-30 NOTE — Telephone Encounter (Addendum)
If you do not think we need a another referral I can use the one  already created. Thank you

## 2022-09-30 NOTE — Telephone Encounter (Signed)
Dr. Eileen Stanford [place] [facility] only accept Medicare patients referrals. Would be some where else you would like me to send the referral

## 2022-10-01 NOTE — Telephone Encounter (Signed)
Can you go in and cancel the previous referral?

## 2022-10-01 NOTE — Telephone Encounter (Signed)
done

## 2022-10-01 NOTE — Addendum Note (Signed)
Addended by: Bertram Savin on: 10/01/2022 02:21 PM   Modules accepted: Orders

## 2022-10-09 ENCOUNTER — Ambulatory Visit: Payer: 59

## 2022-10-09 DIAGNOSIS — M5442 Lumbago with sciatica, left side: Secondary | ICD-10-CM

## 2022-10-09 DIAGNOSIS — R29898 Other symptoms and signs involving the musculoskeletal system: Secondary | ICD-10-CM | POA: Diagnosis not present

## 2022-10-09 DIAGNOSIS — H5712 Ocular pain, left eye: Secondary | ICD-10-CM | POA: Diagnosis not present

## 2022-10-09 DIAGNOSIS — R4189 Other symptoms and signs involving cognitive functions and awareness: Secondary | ICD-10-CM

## 2022-10-09 DIAGNOSIS — S0592XA Unspecified injury of left eye and orbit, initial encounter: Secondary | ICD-10-CM | POA: Diagnosis not present

## 2022-10-09 DIAGNOSIS — M48062 Spinal stenosis, lumbar region with neurogenic claudication: Secondary | ICD-10-CM

## 2022-10-09 DIAGNOSIS — S0990XA Unspecified injury of head, initial encounter: Secondary | ICD-10-CM | POA: Diagnosis not present

## 2022-10-09 DIAGNOSIS — R200 Anesthesia of skin: Secondary | ICD-10-CM | POA: Diagnosis not present

## 2022-10-09 DIAGNOSIS — G8929 Other chronic pain: Secondary | ICD-10-CM

## 2022-10-09 DIAGNOSIS — M5410 Radiculopathy, site unspecified: Secondary | ICD-10-CM | POA: Diagnosis not present

## 2022-10-09 DIAGNOSIS — M791 Myalgia, unspecified site: Secondary | ICD-10-CM | POA: Diagnosis not present

## 2022-10-09 MED ORDER — GADOBENATE DIMEGLUMINE 529 MG/ML IV SOLN
10.0000 mL | Freq: Once | INTRAVENOUS | Status: AC | PRN
  Administered 2022-10-09: 10 mL via INTRAVENOUS

## 2022-10-18 ENCOUNTER — Encounter: Payer: Self-pay | Admitting: Neurology

## 2022-11-15 ENCOUNTER — Telehealth: Payer: Self-pay | Admitting: Neurology

## 2022-11-15 NOTE — Telephone Encounter (Signed)
Pt states she was told Dr Lucia Gaskins would submit a referral for a sleep consult, she has not been contacted, please reply.

## 2022-11-18 NOTE — Telephone Encounter (Signed)
Spoke to patient asking about sleep consult referral Pt states discussed in last office visit with Dr Lucia Gaskins. Made pt aware Dr Lucia Gaskins out the office will be back Wednesday and will make her aware of sleep study request . Pt states thank you

## 2022-11-20 ENCOUNTER — Encounter: Payer: Self-pay | Admitting: Neurology

## 2022-11-20 NOTE — Telephone Encounter (Signed)
I sent her a patient mychart message to get more details, thanks

## 2022-11-21 NOTE — Telephone Encounter (Signed)
Noted  

## 2022-12-23 NOTE — Addendum Note (Signed)
Addended by: Naomie Dean B on: 12/23/2022 04:47 PM   Modules accepted: Orders

## 2023-01-10 ENCOUNTER — Telehealth: Payer: Self-pay | Admitting: Neurology

## 2023-01-10 NOTE — Telephone Encounter (Signed)
Pt r/s appt.

## 2023-01-16 ENCOUNTER — Institutional Professional Consult (permissible substitution): Payer: 59 | Admitting: Neurology

## 2023-01-30 ENCOUNTER — Institutional Professional Consult (permissible substitution): Payer: 59 | Admitting: Neurology

## 2023-01-30 ENCOUNTER — Encounter: Payer: Self-pay | Admitting: Neurology

## 2023-02-05 ENCOUNTER — Telehealth: Payer: Self-pay | Admitting: Neurology

## 2023-02-05 NOTE — Telephone Encounter (Signed)
Pt LVM at 10/:52am asking for a call back regarding appt.  Called pt back, patient ask  to reschedule appt. Informed patient would need to transfer to  Billing to pay no show fee  of $50 for appt on missed on  01/30/23. Transferred Billing.

## 2023-02-24 DIAGNOSIS — Z0289 Encounter for other administrative examinations: Secondary | ICD-10-CM

## 2023-03-05 ENCOUNTER — Institutional Professional Consult (permissible substitution): Payer: 59 | Admitting: Neurology

## 2023-03-24 ENCOUNTER — Ambulatory Visit (INDEPENDENT_AMBULATORY_CARE_PROVIDER_SITE_OTHER): Payer: 59 | Admitting: Neurology

## 2023-03-24 ENCOUNTER — Encounter: Payer: Self-pay | Admitting: Neurology

## 2023-03-24 VITALS — BP 120/68 | HR 77 | Ht 60.2 in | Wt 123.0 lb

## 2023-03-24 DIAGNOSIS — S0990XA Unspecified injury of head, initial encounter: Secondary | ICD-10-CM

## 2023-03-24 DIAGNOSIS — R5383 Other fatigue: Secondary | ICD-10-CM | POA: Diagnosis not present

## 2023-03-24 DIAGNOSIS — R519 Headache, unspecified: Secondary | ICD-10-CM

## 2023-03-24 DIAGNOSIS — M791 Myalgia, unspecified site: Secondary | ICD-10-CM

## 2023-03-24 MED ORDER — TRAZODONE HCL 50 MG PO TABS: 50.0000 mg | ORAL_TABLET | Freq: Every evening | ORAL | 1 refills | Status: DC | PRN

## 2023-03-24 NOTE — Patient Instructions (Signed)
 Insomnia Insomnia is a sleep disorder that makes it difficult to fall asleep or stay asleep. Insomnia can cause fatigue, low energy, difficulty concentrating, mood swings, and poor performance at work or school. There are three different ways to classify insomnia: Difficulty falling asleep. Difficulty staying asleep. Waking up too early in the morning. Any type of insomnia can be long-term (chronic) or short-term (acute). Both are common. Short-term insomnia usually lasts for 3 months or less. Chronic insomnia occurs at least three times a week for longer than 3 months. What are the causes? Insomnia may be caused by another condition, situation, or substance, such as: Having certain mental health conditions, such as anxiety and depression. Using caffeine, alcohol, tobacco, or drugs. Having gastrointestinal conditions, such as gastroesophageal reflux disease (GERD). Having certain medical conditions. These include: Asthma. Alzheimer's disease. Stroke. Chronic pain. An overactive thyroid gland (hyperthyroidism). Other sleep disorders, such as restless legs syndrome and sleep apnea. Menopause. Sometimes, the cause of insomnia may not be known. What increases the risk? Risk factors for insomnia include: Gender. Females are affected more often than males. Age. Insomnia is more common as people get older. Stress and certain medical and mental health conditions. Lack of exercise. Having an irregular work schedule. This may include working night shifts and traveling between different time zones. What are the signs or symptoms? If you have insomnia, the main symptom is having trouble falling asleep or having trouble staying asleep. This may lead to other symptoms, such as: Feeling tired or having low energy. Feeling nervous about going to sleep. Not feeling rested in the morning. Having trouble concentrating. Feeling irritable, anxious, or depressed. How is this diagnosed? This condition  may be diagnosed based on: Your symptoms and medical history. Your health care provider may ask about: Your sleep habits. Any medical conditions you have. Your mental health. A physical exam. How is this treated? Treatment for insomnia depends on the cause. Treatment may focus on treating an underlying condition that is causing the insomnia. Treatment may also include: Medicines to help you sleep. Counseling or therapy. Lifestyle adjustments to help you sleep better. Follow these instructions at home: Eating and drinking  Limit or avoid alcohol, caffeinated beverages, and products that contain nicotine and tobacco, especially close to bedtime. These can disrupt your sleep. Do not eat a large meal or eat spicy foods right before bedtime. This can lead to digestive discomfort that can make it hard for you to sleep. Sleep habits  Keep a sleep diary to help you and your health care provider figure out what could be causing your insomnia. Write down: When you sleep. When you wake up during the night. How well you sleep and how rested you feel the next day. Any side effects of medicines you are taking. What you eat and drink. Make your bedroom a dark, comfortable place where it is easy to fall asleep. Put up shades or blackout curtains to block light from outside. Use a white noise machine to block noise. Keep the temperature cool. Limit screen use before bedtime. This includes: Not watching TV. Not using your smartphone, tablet, or computer. Stick to a routine that includes going to bed and waking up at the same times every day and night. This can help you fall asleep faster. Consider making a quiet activity, such as reading, part of your nighttime routine. Try to avoid taking naps during the day so that you sleep better at night. Get out of bed if you are still awake after  15 minutes of trying to sleep. Keep the lights down, but try reading or doing a quiet activity. When you feel  sleepy, go back to bed. General instructions Take over-the-counter and prescription medicines only as told by your health care provider. Exercise regularly as told by your health care provider. However, avoid exercising in the hours right before bedtime. Use relaxation techniques to manage stress. Ask your health care provider to suggest some techniques that may work well for you. These may include: Breathing exercises. Routines to release muscle tension. Visualizing peaceful scenes. Make sure that you drive carefully. Do not drive if you feel very sleepy. Keep all follow-up visits. This is important. Contact a health care provider if: You are tired throughout the day. You have trouble in your daily routine due to sleepiness. You continue to have sleep problems, or your sleep problems get worse. Get help right away if: You have thoughts about hurting yourself or someone else. Get help right away if you feel like you may hurt yourself or others, or have thoughts about taking your own life. Go to your nearest emergency room or: Call 911. Call the National Suicide Prevention Lifeline at (906)021-1611 or 988. This is open 24 hours a day. Text the Crisis Text Line at 325-069-2793. Summary Insomnia is a sleep disorder that makes it difficult to fall asleep or stay asleep. Insomnia can be long-term (chronic) or short-term (acute). Treatment for insomnia depends on the cause. Treatment may focus on treating an underlying condition that is causing the insomnia. Keep a sleep diary to help you and your health care provider figure out what could be causing your insomnia. This information is not intended to replace advice given to you by your health care provider. Make sure you discuss any questions you have with your health care provider. Document Revised: 12/11/2020 Document Reviewed: 12/11/2020 Elsevier Patient Education  2024 [employer].

## 2023-03-24 NOTE — Progress Notes (Signed)
 SLEEP MEDICINE CLINIC    Provider:  Melvyn Novas, MD  Primary Care Physician:  Jackelyn Poling, DO [address] [place] [address]     Referring Provider: Anson Fret, Md [address] [place],  [place] [address]          Chief Complaint according to patient   Patient presents with:     New Patient (Initial Visit)           HISTORY OF PRESENT ILLNESS:  Erin Costa is a 48 y.o. female patient who is seen upon Dr Trevor Mace referral on 03/24/2023  for a sleep evaluation. [name]  is to be seen here for excessive daytime sleepiness , morning headaches,  frequent nighttime awakenings.    Chief concern according to patient :  " I need to be tested for sleep apnea but I don't snore"- I toss and turn".  When I travel to the beach I will sleep better after day 3 . " At hoe, my mind is racing.    I have the pleasure of seeing Erin Costa 03/24/23 a right -handed AA female with a possible sleep disorder.      Sleep relevant medical history: no Nocturia,no Sleep walking, no Tonsillectomy, had a  concussion  in late September- and sees Dr Lucia Gaskins, for posttraumatic changes in headaches.  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 Chronic insomnia.   Family medical /sleep history:  daughter , now 20,  and husband) on CPAP with OSA, insomnia,   Social history: marries with 2 children, a son still at home and a daughter just off to college.  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 She  lives in a household with 2 persons.  She works in daytime-   Pets are not present. Tobacco use: none  ETOH use 1/month ,  Caffeine intake in form of Coffee( 1 cup in AM ) Soda( /) Tea ( /) no  energy drinks Exercise in form of walking, but not regular. .   Hobbies : travel       Sleep habits are as follows: The patient's dinner time is between 7-8  PM. The patient goes to bed at 12 PM and continues to  struggle with racing thoughts.  Total first sleep for interval of 3-4  hours, wakes for unknown reasons, no trigger identified, no  bathroom breaks, she wakes frequently  the first time at 2-3 AM.  May or may not get another 2-3 hours of sleep.   The preferred sleep position is lateral, right - but ends up on her back- , with the support of 2 pillows and adjusted bed- 20 degrees.  If I lay flat I will choke.  Dreams are reportedly infrequent..  The patient wakes up spontaneously  before the alarm. 6-6.15  AM is the usual rise time. She needs to put her son on the bus. She reports not feeling refreshed or restored in AM, with symptoms such as dry mouth, morning headaches, and residual fatigue. Naps are taken infrequently,  but she is  unable to initiate sleep- even if yawning, feeling exhausted.    Review of Systems: Out of a complete 14 system review, the patient complains of only the following symptoms, and all other reviewed systems are negative.:  Fatigue, sleepiness , fragmented sleep, Insomnia,   Morning headaches, posttraumatic headaches.    How  likely are you to doze in the following situations: 0 = not likely, 1 = slight chance, 2 = moderate chance, 3 = high chance   Sitting and Reading? Watching Television? Sitting inactive in a public place (theater or meeting)? As a passenger in a car for an hour without a break? Lying down in the afternoon when circumstances permit? Sitting and talking to someone? Sitting quietly after lunch without alcohol? In a car, while stopped for a few minutes in traffic?   Total = 11 24 points -   FSS endorsed at 42/ 63 points.   Exhausted, morning headaches, insomnia.  Social History   Socioeconomic History   Marital status: Married    Spouse name: Not on file   Number of children: 2   Years of education: Not on file   Highest education level: Master's degree (e.g., MA, MS, MEng, MEd, MSW, MBA)  Occupational History   Occupation: Arts development officer   Tobacco Use   Smoking status: Never   Smokeless tobacco: Never  Vaping Use   Vaping status: Never Used  Substance and Sexual Activity   Alcohol use: Yes    Comment: wine occasionally   Drug use: No   Sexual activity: Not on file  Other Topics Concern   Not on file  Social History Narrative   Lives at home with husband and 2 children   Right handed   Caffeine: 1 cup coffee daily and green tea <20 oz. Daily    Social Drivers of Corporate investment banker Strain: Not on file  Food Insecurity: Not on file  Transportation Needs: Not on file  Physical Activity: Not on file  Stress: Not on file  Social Connections: Not on file    Family History  Problem Relation Age of Onset   Stroke Mother    Cancer Father        lung cancer   Dementia Maternal Aunt    Stroke Maternal Grandmother    Graves' disease Sister     Past Medical History:  Diagnosis Date   Anemia    hx of    Back pain    Bursitis    to left hip   GERD (gastroesophageal reflux disease)    Headache    Heart murmur    Hypoglycemia    Hypoglycemia    Kidney stones    calcium oxalate and one other kind   Palpitations    Vitamin D deficiency     Past Surgical History:  Procedure Laterality Date   CYSTOSCOPY WITH RETROGRADE PYELOGRAM, URETEROSCOPY AND STENT PLACEMENT Bilateral 05/10/2016   Procedure: CYSTOSCOPY WITH RETROGRADE PYELOGRAM, URETEROSCOPY, BASKET EXTRACTION OF STONE ON LEFT;  Surgeon: Malen Gauze, MD;  Location: [facility];  Service: Urology;  Laterality: Bilateral;   UTERINE FIBROID SURGERY       Current Outpatient Medications on File Prior to Visit  Medication Sig Dispense Refill   ACETAMINOPHEN PO Take by mouth as needed.     cetirizine (ZYRTEC) 10 MG tablet Take 10 mg by mouth daily.     cholecalciferol (VITAMIN D3) 25 MCG (1000 UNIT) tablet Take by mouth daily. 10,000 units once weekly     gabapentin (NEURONTIN) 300 MG capsule Take 1 capsule (300 mg total) by mouth 3 (three)  times daily as needed. For nerve pain. 90 capsule 11   HYDROcodone-acetaminophen (NORCO/VICODIN) 5-325 MG tablet Take 1-2 tablets by mouth every 6 (six) hours as needed for moderate pain. 56 tablet 0   ibuprofen (  ADVIL) 600 MG tablet Take 1 tablet (600 mg total) by mouth every 6 (six) hours as needed. 30 tablet 0   levonorgestrel (MIRENA) 20 MCG/24HR IUD 1 each by Intrauterine route once.     Omega-3 Fatty Acids (OMEGA-3 FISH OIL PO) Take by mouth. 1 capsule daily     tacrolimus (PROTOPIC) 0.1 % ointment Apply 1 application topically daily. (Patient taking differently: Apply 1 application  topically as needed.) 100 g 1   TURMERIC PO Take 2 capsules by mouth daily.     cyclobenzaprine (FLEXERIL) 10 MG tablet Take 1 tablet (10 mg total) by mouth at bedtime as needed for muscle spasms. (Patient not taking: Reported on 03/24/2023) 30 tablet 3   diclofenac Sodium (VOLTAREN) 1 % GEL Apply 4 g topically 4 (four) times daily. (Patient not taking: Reported on 03/24/2023) 350 g 6   IBUPROFEN PO Take by mouth as needed. (Patient not taking: Reported on 03/24/2023)     No current facility-administered medications on file prior to visit.    Allergies  Allergen Reactions   Dovonex [Calcipotriene]     rash   Other     Dovinex cream, reaction     DIAGNOSTIC DATA (LABS, IMAGING, TESTING) - I reviewed patient records, labs, notes, testing and imaging myself where available.  Lab Results  Component Value Date   WBC 4.5 10/13/2018   HGB 13.5 10/13/2018   HCT 41.5 10/13/2018   MCV 83.2 10/13/2018   PLT 257 10/13/2018      Component Value Date/Time   NA 138 07/14/2018 1042   NA 141 06/23/2017 1401   K 4.5 07/14/2018 1042   CL 105 07/14/2018 1042   CO2 25 07/14/2018 1042   GLUCOSE 97 07/14/2018 1042   BUN 13 07/14/2018 1042   BUN 17 06/23/2017 1401   CREATININE 0.84 07/14/2018 1042   CALCIUM 9.6 07/14/2018 1042   PROT 8.6 (H) 07/14/2018 1042   PROT 7.3 06/23/2017 1401   ALBUMIN 4.2 07/14/2018  1042   ALBUMIN 4.5 06/23/2017 1401   AST 22 07/14/2018 1042   ALT 22 07/14/2018 1042   ALKPHOS 62 07/14/2018 1042   BILITOT 0.5 07/14/2018 1042   GFRNONAA >60 07/14/2018 1042   GFRAA >60 07/14/2018 1042   No results found for: "CHOL", "HDL", "LDLCALC", "LDLDIRECT", "TRIG", "CHOLHDL" Lab Results  Component Value Date   HGBA1C 5.7 (H) 01/18/2020   Lab Results  Component Value Date   VITAMINB12 693 01/18/2020   Lab Results  Component Value Date   TSH 0.768 06/23/2017    PHYSICAL EXAM:  Today's Vitals   03/24/23 1305  BP: 120/68  Pulse: 77  Weight: 123 lb (55.8 kg)  Height: 5' 0.2" (1.529 m)   Body mass index is 23.86 kg/m.   Wt Readings from Last 3 Encounters:  03/24/23 123 lb (55.8 kg)  09/23/22 129 lb (58.5 kg)  05/02/22 126 lb (57.2 kg)     Ht Readings from Last 3 Encounters:  03/24/23 5' 0.2" (1.529 m)  09/23/22 5\' 2"  (1.575 m)  05/02/22 5' 2.6" (1.59 m)      General: The patient is awake, alert and appears not in acute distress. The patient is well groomed. Head: Normocephalic, atraumatic. Neck is supple.  Mallampati 3,  neck circumference:13. 5 inches . Nasal airflow fully patent.   Retrognathia is  seen.  Dental status:  biological  Cardiovascular:  Regular rate and cardiac rhythm by pulse,  without distended neck veins. Respiratory: Lungs are clear to auscultation.  Skin:  Without evidence of ankle edema, or rash. Trunk: The patient's posture is erect.   NEUROLOGIC EXAM: The patient is awake and alert, oriented to place and time.   Memory subjective described as intact.  Attention span & concentration ability appears normal.  Speech is fluent,  without  dysarthria, dysphonia or aphasia.  Mood and affect are appropriate.   Cranial nerves: no loss of smell or taste reported  Pupils are equal and briskly reactive to light. Funduscopic exam deferred. .  Extraocular movements in vertical and horizontal planes were intact and without nystagmus. No  Diplopia. Visual fields by finger perimetry are intact. Hearing was intact to soft voice and finger rubbing.  Right ear pressure also cased some hearing deficits.  Facial sensation intact to fine touch. She reports a new sensation over the right midface, not affecting sleep. ear pressure.  Facial motor strength is symmetric and tongue and uvula move midline.  Neck ROM : rotation, tilt and flexion extension were normal for age and shoulder shrug was symmetrical.    Motor exam:  Symmetric bulk, tone and ROM.   Normal tone without cog- wheeling, asymmetric grip strength . Left hand decreased over right. Carpaltunnel.    Sensory:  Fine touch, vibration were tested  : nl present in ankles and hands/ fingertips.  Proprioception tested in the upper extremitieswas normal.   Coordination: Rapid alternating movements in the fingers/hands were of normal speed.  The Finger-to-nose maneuver was intact without evidence of ataxia, dysmetria or tremor.   Gait and station: Patient could rise unassisted from a seated position, walked without assistive device.  Stance is of normal width/ base and the patient turned with 3 steps ( observed) .  Toe and heel walk were deferred.  Deep tendon reflexes: in the  upper and lower extremities are symmetric and intact.  Babinski response was deferred .    ASSESSMENT AND PLAN 48 y.o. year old female  here with:  Few rsik factors for OSA m only high grade mallompatti.    1) difficulties to maintain and to initiate sleep- racing mind.  This is a chronic finding.   She has some hot flashes and had some perimenopausal  symptoms.   2) unclear if she has ever snored, but she could have had positional dependent  choking sensations and she breathes easier when the head of bed is raised, still  using 2 extra pillows.  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  No caffeine in PM.  Eat dinner earlier - and go to bed at  10.30 /11 Pm and  rise at 6 AM - allowing 7 hours of sleep time   Insomnia instructions given, HST ordered to ruel out sleep apnea.   I plan to follow up either personally or through our NP within 3-5 months.   I would like to thank Jackelyn Poling, DO and Anson Fret, Md [address] [place],  [place] [address] for allowing me to meet with and to take care of this pleasant patient.     After spending a total time of  35  minutes face to face and additional time for physical and neurologic examination, review of laboratory studies,  personal review of imaging studies, reports and results of other testing and review of referral information / records as far  as provided in visit,   Electronically signed by: Melvyn Novas, MD 03/24/2023 1:18 PM  [facility] Board certified by The [employer] of Sleep Medicine and Diplomate of the [employer] of Sleep Medicine. Board certified In Neurology through the ABPN, Fellow of the [employer] of Neurology.

## 2023-04-18 ENCOUNTER — Ambulatory Visit: Admitting: Neurology

## 2023-04-18 DIAGNOSIS — R5383 Other fatigue: Secondary | ICD-10-CM

## 2023-04-18 DIAGNOSIS — R519 Headache, unspecified: Secondary | ICD-10-CM

## 2023-04-18 DIAGNOSIS — S0990XA Unspecified injury of head, initial encounter: Secondary | ICD-10-CM

## 2023-04-18 DIAGNOSIS — G4710 Hypersomnia, unspecified: Secondary | ICD-10-CM

## 2023-05-16 ENCOUNTER — Emergency Department (HOSPITAL_BASED_OUTPATIENT_CLINIC_OR_DEPARTMENT_OTHER)
Admission: EM | Admit: 2023-05-16 | Discharge: 2023-05-16 | Disposition: A | Discharge status: Home / Self Care | Attending: Emergency Medicine | Admitting: Emergency Medicine

## 2023-05-16 ENCOUNTER — Encounter (HOSPITAL_BASED_OUTPATIENT_CLINIC_OR_DEPARTMENT_OTHER): Payer: Self-pay | Admitting: Emergency Medicine

## 2023-05-16 ENCOUNTER — Other Ambulatory Visit: Payer: Self-pay

## 2023-05-16 DIAGNOSIS — Y990 Civilian activity done for income or pay: Secondary | ICD-10-CM | POA: Diagnosis not present

## 2023-05-16 DIAGNOSIS — S0990XA Unspecified injury of head, initial encounter: Secondary | ICD-10-CM | POA: Diagnosis present

## 2023-05-16 DIAGNOSIS — W228XXA Striking against or struck by other objects, initial encounter: Secondary | ICD-10-CM | POA: Insufficient documentation

## 2023-05-16 MED ORDER — KETOROLAC TROMETHAMINE 30 MG/ML IJ SOLN
30.0000 mg | Freq: Once | INTRAMUSCULAR | Status: AC
  Administered 2023-05-16: 30 mg via INTRAMUSCULAR
  Filled 2023-05-16: qty 1

## 2023-05-16 NOTE — ED Provider Notes (Signed)
 [place] EMERGENCY DEPARTMENT AT [facility] Provider Note   CSN: [id number] Arrival date & time: 05/16/23  1804    History  Chief Complaint  Patient presents with   Head Injury    Erin Costa is a 48 y.o. female here for evaluation of head injury.  Has been dealing with coughing and sneezing due to seasonal allergies.  Was at work pushed back from her chair as she thought she was going to sneeze and when she did she subsequently hit her forehead on the desk.  Tetanus is up-to-date.  She has had a diffuse headache since.  She does note history of migraine as well as recurrent headache due to concussion per neurology note follows with Dr. Tresia Fruit.  No numbness, weakness, slurred speech.  No neck pain  HPI     Home Medications Prior to Admission medications   Medication Sig Start Date End Date Taking? Authorizing Provider  ACETAMINOPHEN  PO Take by mouth as needed.    [provider]  cetirizine (ZYRTEC) 10 MG tablet Take 10 mg by mouth daily.    [provider]  cholecalciferol (VITAMIN D3) 25 MCG (1000 UNIT) tablet Take by mouth daily. 10,000 units once weekly    [provider]  gabapentin  (NEURONTIN ) 300 MG capsule Take 1 capsule (300 mg total) by mouth 3 (three) times daily as needed. For nerve pain. 07/17/22   Glory Larsen, MD  HYDROcodone -acetaminophen  (NORCO/VICODIN) 5-325 MG tablet Take 1-2 tablets by mouth every 6 (six) hours as needed for moderate pain. 09/23/22   Glory Larsen, MD  ibuprofen  (ADVIL ) 600 MG tablet Take 1 tablet (600 mg total) by mouth every 6 (six) hours as needed. 09/21/22   Horton, Vonzella Guernsey, MD  IBUPROFEN  PO Take by mouth as needed. Patient not taking: Reported on 03/24/2023    [provider]  levonorgestrel (MIRENA) 20 MCG/24HR IUD 1 each by Intrauterine route once.    [provider]  Omega-3 Fatty Acids (OMEGA-3 FISH OIL PO) Take by mouth. 1 capsule daily    [provider]  traZODone   (DESYREL ) 50 MG tablet Take 1 tablet (50 mg total) by mouth at bedtime as needed for sleep. 03/24/23   Dohmeier, Raoul Byes, MD  TURMERIC PO Take 2 capsules by mouth daily.    [provider]      Allergies    Dovonex [calcipotriene] and Other    Review of Systems   Review of Systems  Constitutional: Negative.   HENT: Negative.    Respiratory: Negative.    Cardiovascular: Negative.   Gastrointestinal: Negative.   Genitourinary: Negative.   Musculoskeletal: Negative.   Skin:  Positive for wound.  Neurological:  Positive for headaches. Negative for dizziness, tremors, seizures, syncope, facial asymmetry, speech difficulty, weakness and numbness.  All other systems reviewed and are negative.   Physical Exam Updated Vital Signs BP 122/82   Pulse 75   Temp 98.1 F (36.7 C) (Oral)   Resp 16   SpO2 98%  Physical Exam Vitals and nursing note reviewed.  Constitutional:      General: She is not in acute distress.    Appearance: She is well-developed. She is not ill-appearing, toxic-appearing or diaphoretic.  HENT:     Head: Normocephalic.     Comments: 1cm forehead hematoma with abrasion. No laceration. Eyes:     Pupils: Pupils are equal, round, and reactive to light.  Neck:     Comments: No midline tenderness Cardiovascular:  Rate and Rhythm: Normal rate.     Pulses: Normal pulses.     Heart sounds: Normal heart sounds.  Pulmonary:     Effort: Pulmonary effort is normal. No respiratory distress.     Breath sounds: Normal breath sounds.  Abdominal:     General: Bowel sounds are normal. There is no distension.     Palpations: Abdomen is soft.     Tenderness: There is no abdominal tenderness. There is no guarding or rebound.  Musculoskeletal:        General: Normal range of motion.     Cervical back: Normal range of motion and neck supple.     Comments: No bony tenderness, full range of motion  Skin:    General: Skin is warm and dry.     Capillary Refill:  Capillary refill takes less than 2 seconds.  Neurological:     General: No focal deficit present.     Mental Status: She is alert.     Cranial Nerves: No cranial nerve deficit.     Motor: No weakness.     Gait: Gait normal.  Psychiatric:        Mood and Affect: Mood normal.     ED Results / Procedures / Treatments   Labs (all labs ordered are listed, but only abnormal results are displayed) Labs Reviewed - No data to display  EKG None  Radiology No results found.  Procedures Procedures    Medications Ordered in ED Medications  ketorolac  (TORADOL ) 30 MG/ML injection 30 mg (30 mg Intramuscular Given 05/16/23 1909)    ED Course/ Medical Decision Making/ A&P   48 year old here for evaluation of head injury.  Sneezed at work subsequently going forward and hitting her head on table. She has 1 cm appears to be a small forehead hematoma with overlying abrasion however no lacerations.  She has a nonfocal neuroexam without deficits.  No obvious step-offs of facial bones.  She does have chronic headache and prior concussion from injury as well.  She does not need any sutures today.  I discussed imaging with patient and family today.  They declined.  We discussed risk versus benefit to include missed bleed, fracture.  Voiced understanding.  She is agreeable to some Toradol .  Discussed symptomatic management at home.  Will have her follow-up outpatient, return for any worsening symptoms.  The patient has been appropriately medically screened and/or stabilized in the ED. I have low suspicion for any other emergent medical condition which would require further screening, evaluation or treatment in the ED or require inpatient management.  Patient is hemodynamically stable and in no acute distress.  Patient able to ambulate in department prior to ED.  Evaluation does not show acute pathology that would require ongoing or additional emergent interventions while in the emergency department or  further inpatient treatment.  I have discussed the diagnosis with the patient and answered all questions.  Pain is been managed while in the emergency department and patient has no further complaints prior to discharge.  Patient is comfortable with plan discussed in room and is stable for discharge at this time.  I have discussed strict return precautions for returning to the emergency department.  Patient was encouraged to follow-up with PCP/specialist refer to at discharge.                                 Medical Decision Making Amount and/or Complexity of Data  Reviewed Independent Historian: spouse External Data Reviewed: labs, radiology and notes.  Risk OTC drugs. Prescription drug management. Decision regarding hospitalization. Diagnosis or treatment significantly limited by social determinants of health.           Final Clinical Impression(s) / ED Diagnoses Final diagnoses:  Injury of head, initial encounter    Rx / DC Orders ED Discharge Orders     None         Kathrina Crosley A, PA-C 05/16/23 1936    Flonnie Humphrey, DO 05/17/23 0016

## 2023-05-16 NOTE — ED Triage Notes (Signed)
 Pt was in a meeting at work, slid back in her chair to sneeze and struck her head on the table.  Laceration with bleeding. No LOC.  "Throbbing" with sensitivity to light and sound.

## 2023-05-16 NOTE — Discharge Instructions (Addendum)
 It was a pleasure taking care of you here today.  I would recommend Tylenol  and ibuprofen  for your headache.  Also recommend icing the area on your head that is swollen.  Make sure to follow-up outpatient, return for any worsening symptoms

## 2023-05-30 NOTE — Progress Notes (Signed)
 [facility]   HOME SLEEP TEST REPORT ( by Katheen Palma  mail -out device )   STUDY DATE:  05-13-2023 Data received : 05-29-2023   ORDERING CLINICIAN: Neomia Banner, MD  REFERRING CLINICIAN:    Aldona Amel, MD  and PCP Mordechai April, DO    CLINICAL INFORMATION/HISTORY: seen upon Dr Harding Li referral on 03/24/2023 for a sleep evaluation, Erin Costa  is reporting  excessive daytime sleepiness , morning headaches,  frequent nighttime awakenings. Had a concussion in 09-2022, since then posttraumatic headaches. 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 Chronic insomnia. Feeling exhausted  and not restored , waking with HA.     Chief concern according to patient :  " I need to be tested for sleep apnea but I don't snore"- I toss and turn".  When I travel to the beach I will sleep better after day 3 . " At home, my mind is racing when I try to sleep".    Epworth sleepiness score: 11 24 points - elevated . FSS endorsed at 42/ 63 points. Elevated  BMI: 23.8 kg/m Neck Circumference: 13.5 "    Sleep Test Summary:   Total Recording Time (hours, min): 8 h 9 m       Total Effective Sleep Time (hours, min):   5 h 46 minutes  Sleep efficiency %;   75%                                    Respiratory Indices by AASM Criteria:    Calculated total pAHI (per hour):4.7/h                                                  Positional  respiratory activity  / snoring : there was minimal snoring activity.   Oxygen Saturation  in Sleep    Oxygen Saturation (%) Mean:  96%                 O2 Saturation Range (%):  between an nadir at 84.4% and max saturation at  100% with  1 minute of total desaturation time <89%.           Pulse Rate in Sleep :   Pulse Mean (bpm):    77             Pulse Range:      between 66 and 98 bpm, in NSR.           IMPRESSION:  This HST did detect very infrequent apneas/ hypopneas within the physiological norm. A diagnosis of sleep apnea was not made.   There were  also no other indicators of any physiological sleep disorder.    RECOMMENDATION: Negative HST .  A follow up in the sleep clinic is not needed.   PS :  The preferred treatment of insomnia is by cognitive behavior therapy , addressing also disorders such as anxiety,  depression or OCD.  Sleep hygiene rules should be implemented.     INTERPRETING PHYSICIAN:  Neomia Banner, MD  [facility] and [facility] Sleep Board certified by The [employer] of Sleep Medicine and Diplomate of the [employer] of Sleep Medicine. Board certified In Neurology through the ABPN, Fellow of  the [employer] of Neurology.

## 2023-06-06 ENCOUNTER — Ambulatory Visit: Payer: Self-pay | Admitting: Neurology

## 2023-06-06 NOTE — Procedures (Signed)
 [facility] at [facility]   HOME SLEEP TEST REPORT ( by Katheen Palma  mail -out device )   STUDY DATE:  05-13-2023 Data received : 05-29-2023   ORDERING CLINICIAN: Neomia Banner, MD  REFERRING CLINICIAN:    Aldona Amel, MD  and PCP Mordechai April, DO    CLINICAL INFORMATION/HISTORY: seen upon Dr Harding Li referral on 03/24/2023 for a sleep evaluation, Ms. Crandle  is reporting  excessive daytime sleepiness , morning headaches,  frequent nighttime awakenings. Had a concussion in 09-2022, since then posttraumatic headaches. 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 Chronic insomnia. Feeling exhausted  and not restored , waking with HA.     Chief concern according to patient :  " I need to be tested for sleep apnea but I don't snore"- I toss and turn".  When I travel to the beach I will sleep better after day 3 . " At home, my mind is racing when I try to sleep".    Epworth sleepiness score: 11 24 points - elevated . FSS endorsed at 42/ 63 points. Elevated  BMI: 23.8 kg/m Neck Circumference: 13.5 "    Sleep Test Summary:   Total Recording Time (hours, min): 8 h 9 m       Total Effective Sleep Time (hours, min):   5 h 46 minutes  Sleep efficiency %;   75%                                    Respiratory Indices by AASM Criteria:    Calculated total pAHI (per hour):4.7/h                                                  Positional  respiratory activity  / snoring : there was minimal snoring activity.   Oxygen Saturation  in Sleep    Oxygen Saturation (%) Mean:  96%                 O2 Saturation Range (%):  between an nadir at 84.4% and max saturation at  100% with  1 minute of total desaturation time <89%.           Pulse Rate in Sleep :   Pulse Mean (bpm):    77             Pulse Range:      between 66 and 98 bpm, in NSR.           IMPRESSION:  This HST did detect very infrequent apneas/ hypopneas within the physiological norm. A diagnosis of sleep apnea was not made.   There were also no  other indicators of any physiological sleep disorder.    RECOMMENDATION: Negative HST .  A follow up in the sleep clinic is not needed.   PS :  The preferred treatment of insomnia is by cognitive behavior therapy , addressing also disorders such as anxiety,  depression or OCD.  Sleep hygiene rules should be implemented.     INTERPRETING PHYSICIAN:  Neomia Banner, MD  [facility] and [facility] Sleep Board certified by The [employer] of Sleep Medicine and Diplomate of the [employer] of Sleep Medicine. Board certified In Neurology through the ABPN, Fellow of the [employer]  of Neurology.

## 2023-06-10 ENCOUNTER — Telehealth: Payer: Self-pay | Admitting: *Deleted

## 2023-06-10 NOTE — Telephone Encounter (Signed)
 I had pt on phone re: to her mother and she then stated that she wanted to see about her referral to hand surgeon for CTS , I see in referrals 07-2022.  Pt was not aware or did not receive call.    I told her will try to re send.  They may want to have a new referral since it has been a while.  She verbalized understanding.

## 2023-06-11 NOTE — Telephone Encounter (Signed)
Referral for hand surgery fax to [facility] [place]. Phone: [phone], Fax: [phone].

## 2023-06-17 NOTE — Telephone Encounter (Signed)
 Called the patient and reviewed her sleep study. Advised of the findings and recommendations per MD. I have routed the results to Dr Tresia Fruit the referring provider as well as the patient's PCP. Pt verbalized understanding. Pt had no questions at this time but was encouraged to call back if questions arise.

## 2023-06-17 NOTE — Telephone Encounter (Signed)
-----   Message from Baraga Dohmeier sent at 06/06/2023  1:48 PM EDT -----  This HST did detect very infrequent apneas/ hypopneas within the physiological norm. A diagnosis of sleep apnea was not made.  No physiologic cause for a sleep disorder was found. A follow up in the sleep clinic is not needed.

## 2023-09-24 ENCOUNTER — Emergency Department (HOSPITAL_COMMUNITY)
Admission: EM | Admit: 2023-09-24 | Discharge: 2023-09-25 | Disposition: A | Discharge status: Home / Self Care | Source: Ambulatory Visit | Attending: Emergency Medicine | Admitting: Emergency Medicine

## 2023-09-24 ENCOUNTER — Other Ambulatory Visit: Payer: Self-pay

## 2023-09-24 ENCOUNTER — Emergency Department (HOSPITAL_COMMUNITY)

## 2023-09-24 ENCOUNTER — Encounter (HOSPITAL_COMMUNITY): Payer: Self-pay | Admitting: Emergency Medicine

## 2023-09-24 DIAGNOSIS — Z87442 Personal history of urinary calculi: Secondary | ICD-10-CM | POA: Insufficient documentation

## 2023-09-24 DIAGNOSIS — N12 Tubulo-interstitial nephritis, not specified as acute or chronic: Secondary | ICD-10-CM | POA: Diagnosis not present

## 2023-09-24 DIAGNOSIS — R1032 Left lower quadrant pain: Secondary | ICD-10-CM | POA: Diagnosis present

## 2023-09-24 LAB — URINALYSIS, ROUTINE W REFLEX MICROSCOPIC
Bilirubin Urine: NEGATIVE
Glucose, UA: NEGATIVE mg/dL
Ketones, ur: NEGATIVE mg/dL
Nitrite: NEGATIVE
Protein, ur: NEGATIVE mg/dL
Specific Gravity, Urine: 1.024 (ref 1.005–1.030)
pH: 5 (ref 5.0–8.0)

## 2023-09-24 LAB — COMPREHENSIVE METABOLIC PANEL WITH GFR
ALT: 12 U/L (ref 0–44)
AST: 18 U/L (ref 15–41)
Albumin: 4.5 g/dL (ref 3.5–5.0)
Alkaline Phosphatase: 51 U/L (ref 38–126)
Anion gap: 10 (ref 5–15)
BUN: 16 mg/dL (ref 6–20)
CO2: 24 mmol/L (ref 22–32)
Calcium: 9.2 mg/dL (ref 8.9–10.3)
Chloride: 106 mmol/L (ref 98–111)
Creatinine, Ser: 0.64 mg/dL (ref 0.44–1.00)
GFR, Estimated: >60 mL/min (ref >60)
Glucose, Bld: 86 mg/dL (ref 70–99)
Potassium: 4.8 mmol/L (ref 3.5–5.1)
Sodium: 140 mmol/L (ref 135–145)
Total Bilirubin: 0.3 mg/dL (ref 0.0–1.2)
Total Protein: 7.5 g/dL (ref 6.5–8.1)

## 2023-09-24 LAB — CBC
HCT: 37.2 % (ref 36.0–46.0)
Hemoglobin: 11.6 g/dL — ABNORMAL LOW (ref 12.0–15.0)
MCH: 26.4 pg (ref 26.0–34.0)
MCHC: 31.2 g/dL (ref 30.0–36.0)
MCV: 84.5 fL (ref 80.0–100.0)
Platelets: 252 K/uL (ref 150–400)
RBC: 4.4 MIL/uL (ref 3.87–5.11)
RDW: 12.9 % (ref 11.5–15.5)
WBC: 8.1 K/uL (ref 4.0–10.5)
nRBC: 0 % (ref 0.0–0.2)

## 2023-09-24 LAB — HCG, SERUM, QUALITATIVE: Preg, Serum: NEGATIVE

## 2023-09-24 LAB — LIPASE, BLOOD: Lipase: 35 U/L (ref 11–51)

## 2023-09-24 MED ORDER — KETOROLAC TROMETHAMINE 15 MG/ML IJ SOLN
15.0000 mg | Freq: Once | INTRAMUSCULAR | Status: AC
  Administered 2023-09-24: 15 mg via INTRAVENOUS
  Filled 2023-09-24: qty 1

## 2023-09-24 MED ORDER — CEPHALEXIN 500 MG PO CAPS: 500.0000 mg | ORAL_CAPSULE | Freq: Four times a day (QID) | ORAL | 0 refills | Status: DC

## 2023-09-24 MED ORDER — ONDANSETRON HCL 4 MG/2ML IJ SOLN
4.0000 mg | Freq: Once | INTRAMUSCULAR | Status: AC
  Administered 2023-09-24: 4 mg via INTRAVENOUS
  Filled 2023-09-24: qty 2

## 2023-09-24 MED ORDER — SODIUM CHLORIDE 0.9 % IV SOLN
1.0000 g | Freq: Once | INTRAVENOUS | Status: AC
  Administered 2023-09-24: 1 g via INTRAVENOUS
  Filled 2023-09-24: qty 10

## 2023-09-24 MED ORDER — MORPHINE SULFATE (PF) 4 MG/ML IV SOLN
4.0000 mg | Freq: Once | INTRAVENOUS | Status: AC
  Administered 2023-09-24: 4 mg via INTRAVENOUS
  Filled 2023-09-24: qty 1

## 2023-09-24 NOTE — ED Triage Notes (Signed)
 Pt presents to the ED via POV with complaints of LLQ pain that started this evening. Pt notes being seen by UC who advised she come in for imaging. Endorses pain 8/10 - no meds provided PTA. A&Ox4 at this time. Denies N/V/D, CP or SOB.

## 2023-09-24 NOTE — Discharge Instructions (Addendum)
 You had an ultrasound today that does not have any swelling of your kidney.  Your blood work was normal but your urine does look infected.  You have received your first dose of antibiotics here and need to take the next does tomorrow morning.  If you are not getting any better in the next 24-48 hours or you start having persistent vomiting, worsening pain or fever you should return to the ER.

## 2023-09-24 NOTE — ED Provider Notes (Signed)
 Patient presents with concern for acute onset abdominal pain.  History of kidney stones.  However declined CT imaging.  Labs obtained.  Urinalysis consistent with likely UTI.  Ultrasound did not show any evidence of hydronephrosis or obstructing stone.  Awaiting ultrasound for rule out torsion.  Physical Exam  BP 114/75   Pulse 74   Temp 98.7 F (37.1 C)   Resp 16   Ht 1.575 m (5' 2)   Wt 56.6 kg   SpO2 98%   BMI 22.83 kg/m   Physical Exam  Procedures  Procedures  ED Course / MDM    Medical Decision Making Amount and/or Complexity of Data Reviewed Labs: ordered. Radiology: ordered.  Risk Prescription drug management.   ***

## 2023-09-24 NOTE — ED Notes (Signed)
 Patient taken to restroom with wheelchair to obtain urine specimen.

## 2023-09-24 NOTE — ED Notes (Signed)
Patient provided sandwich and beverage.

## 2023-09-24 NOTE — ED Provider Notes (Signed)
 [place] EMERGENCY DEPARTMENT AT [facility] Provider Note   CSN: [id number] Arrival date & time: 09/24/23  8095     Patient presents with: Abdominal Pain   Erin Costa is a 48 y.o. female.   Patient is a 48 year old female with a history of kidney stones, anemia, GERD who is presenting today with complaints of left-sided abdominal pain.  Patient reports that since about 1:00 today she was having some pain in bilateral kidneys but the pain became severe in the last 2 hours causing her to double over in pain.  She denied any vomiting, fevers.  Her last bowel movement was approximately 4 days ago which she says is not always unusual.  She has not had any dysuria, frequency or urgency.  She reports feeling fine yesterday.  She states the pain is still severe but movement does not seem to make it worse.  She went to the [facility] walk-in clinic today and after this evaluation they sent her here for further workup.  Patient does report having a history of kidney stones but have always passed them spontaneously.  No prior abdominal surgeries.  The history is provided by the patient.  Abdominal Pain      Prior to Admission medications   Medication Sig Start Date End Date Taking? Authorizing Provider  cephALEXin  (KEFLEX ) 500 MG capsule Take 1 capsule (500 mg total) by mouth 4 (four) times daily. 09/24/23  Yes Salayah Meares, [place], MD  cetirizine (ZYRTEC) 10 MG tablet Take 10 mg by mouth daily as needed.   Yes [provider]  levonorgestrel (MIRENA) 20 MCG/24HR IUD 1 each by Intrauterine route once.   Yes [provider]  gabapentin  (NEURONTIN ) 300 MG capsule Take 1 capsule (300 mg total) by mouth 3 (three) times daily as needed. For nerve pain. Patient not taking: Reported on 09/24/2023 07/17/22   Ines Onetha NOVAK, MD  HYDROcodone -acetaminophen  (NORCO/VICODIN) 5-325 MG tablet Take 1-2 tablets by mouth every 6 (six) hours as needed for moderate pain. Patient not taking:  Reported on 09/24/2023 09/23/22   Ines Onetha NOVAK, MD  ibuprofen  (ADVIL ) 600 MG tablet Take 1 tablet (600 mg total) by mouth every 6 (six) hours as needed. Patient not taking: Reported on 09/24/2023 09/21/22   Horton, Charmaine FALCON, MD  traZODone  (DESYREL ) 50 MG tablet Take 1 tablet (50 mg total) by mouth at bedtime as needed for sleep. Patient not taking: Reported on 09/24/2023 03/24/23   Dohmeier, Dedra, MD    Allergies: Dovonex [calcipotriene] and Other    Review of Systems  Gastrointestinal:  Positive for abdominal pain.    Updated Vital Signs BP 114/75   Pulse 74   Temp 98.7 F (37.1 C)   Resp 16   Ht 5' 2 (1.575 m)   Wt 56.6 kg   SpO2 98%   BMI 22.83 kg/m   Physical Exam Vitals and nursing note reviewed.  Constitutional:      General: She is not in acute distress.    Appearance: She is well-developed.  HENT:     Head: Normocephalic and atraumatic.  Eyes:     Pupils: Pupils are equal, round, and reactive to light.  Cardiovascular:     Rate and Rhythm: Normal rate and regular rhythm.     Heart sounds: Normal heart sounds. No murmur heard.    No friction rub.  Pulmonary:     Effort: Pulmonary effort is normal.     Breath sounds: Normal breath sounds. No wheezing or rales.  Abdominal:     General: Bowel sounds are normal. There is no distension.     Palpations: Abdomen is soft.     Tenderness: There is abdominal tenderness in the left lower quadrant. There is left CVA tenderness and guarding. There is no rebound.     Hernia: No hernia is present.  Musculoskeletal:        General: No tenderness. Normal range of motion.     Comments: No edema  Skin:    General: Skin is warm and dry.     Findings: No rash.  Neurological:     Mental Status: She is alert and oriented to person, place, and time.     Cranial Nerves: No cranial nerve deficit.  Psychiatric:        Behavior: Behavior normal.     (all labs ordered are listed, but only abnormal results are displayed) Labs  Reviewed  CBC - Abnormal; Notable for the following components:      Result Value   Hemoglobin 11.6 (*)    All other components within normal limits  URINALYSIS, ROUTINE W REFLEX MICROSCOPIC - Abnormal; Notable for the following components:   Hgb urine dipstick MODERATE (*)    Leukocytes,Ua MODERATE (*)    Bacteria, UA MANY (*)    All other components within normal limits  URINE CULTURE  LIPASE, BLOOD  COMPREHENSIVE METABOLIC PANEL WITH GFR  HCG, SERUM, QUALITATIVE    EKG: None  Radiology: US  Renal Result Date: 09/24/2023 CLINICAL DATA:  Left side back pain.  History of kidney stones EXAM: RENAL / URINARY TRACT ULTRASOUND COMPLETE COMPARISON:  03/01/2020 FINDINGS: Right Kidney: Renal measurements: 9.8 x 3.4 x 4.6 cm = volume: 80 mL. Echogenicity within normal limits. No mass or hydronephrosis visualized. Left Kidney: Renal measurements: 11.5 x 5.9 x 3.6 cm = volume: 129 mL. Echogenicity within normal limits. No mass or hydronephrosis visualized. Bladder: Appears normal for degree of bladder distention. Other: None. IMPRESSION: No acute findings.  No hydronephrosis. Electronically Signed   By: Franky Crease M.D.   On: 09/24/2023 22:19   DG Abdomen 1 View Result Date: 09/24/2023 CLINICAL DATA:  Left-sided pain EXAM: ABDOMEN - 1 VIEW COMPARISON:  03/01/2020 FINDINGS: Nonobstructed gas pattern with large stool burden. IUD in the pelvis. Punctate stone right kidney. No definite radiopaque calculi in the region of the left kidney IMPRESSION: Nonobstructed gas pattern with large stool burden. Punctate right kidney stone. Electronically Signed   By: Luke Bun M.D.   On: 09/24/2023 20:55     Procedures   Medications Ordered in the ED  morphine  (PF) 4 MG/ML injection 4 mg (4 mg Intravenous Given 09/24/23 2030)  ondansetron  (ZOFRAN ) injection 4 mg (4 mg Intravenous Given 09/24/23 2030)  ketorolac  (TORADOL ) 15 MG/ML injection 15 mg (15 mg Intravenous Given 09/24/23 2255)  cefTRIAXone  (ROCEPHIN )  1 g in sodium chloride  0.9 % 100 mL IVPB (1 g Intravenous New Bag/Given 09/24/23 2257)                                    Medical Decision Making Amount and/or Complexity of Data Reviewed External Data Reviewed: notes. Labs: ordered. Decision-making details documented in ED Course. Radiology: ordered and independent interpretation performed. Decision-making details documented in ED Course.  Risk Prescription drug management.   Pt with multiple medical problems and comorbidities and presenting today with a complaint that caries a high risk for morbidity and mortality.  Here today with symptoms concerning for kidney stone, pyelonephritis, ovarian cyst rupture, lower suspicion for ectopic pregnancy due to patient having an IUD or torsion.  Also lower suspicion for bowel perforation or diverticulitis.  Patient given pain control. I independently interpreted patient's labs and CBC is within normal limits, UA does show moderate blood, white cells with many bacteria and moderate leukocytes. With normal CMP and lipase. Pregnancy neg. Discussed imaging with the patient and she reports she really does not want a CAT scan because of the radiation and at this time we will do a renal ultrasound and a KUB but did discuss that that does not give us  any answers we may have to do a CT which patient is okay with. I have independently visualized and interpreted pt's images today.  KUB without obvious stones does have some constipation.  Ultrasound without obvious hydronephrosis.  Radiology reports no acute findings and no hydronephrosis.  On reevaluation patient still has left-sided pain given the sudden onset of the pain still concern for possible kidney stone also concern for possible torsion or ovarian cyst.  Discussed all this with the patient.  She does not want a CAT scan and does understand that there could be a stone there that is not being seen but is willing to get an ultrasound to rule out ovarian  pathology.  Patient was given a dose of Rocephin .     Final diagnoses:  Pyelonephritis    ED Discharge Orders          Ordered    cephALEXin  (KEFLEX ) 500 MG capsule  4 times daily        09/24/23 2322               Doretha Folks, MD 09/24/23 2328

## 2023-09-25 ENCOUNTER — Emergency Department (HOSPITAL_COMMUNITY)

## 2023-09-25 MED ORDER — OXYCODONE-ACETAMINOPHEN 5-325 MG PO TABS
1.0000 | ORAL_TABLET | Freq: Once | ORAL | Status: AC
  Administered 2023-09-25: 1 via ORAL
  Filled 2023-09-25: qty 1

## 2023-09-26 LAB — URINE CULTURE: Culture: NO GROWTH

## 2023-10-22 ENCOUNTER — Telehealth: Payer: Self-pay

## 2023-10-22 NOTE — Telephone Encounter (Signed)
 Called and left voicemail for patient to reschedule appointment on 1/5 with Dr Ines.  If patient calls back, they can be rescheduled with Dr Chalice

## 2023-12-04 DIAGNOSIS — Z0289 Encounter for other administrative examinations: Secondary | ICD-10-CM

## 2024-01-19 ENCOUNTER — Ambulatory Visit: Admitting: Neurology

## 2024-01-28 ENCOUNTER — Telehealth: Payer: Self-pay | Admitting: Neurology

## 2024-01-28 ENCOUNTER — Ambulatory Visit: Admitting: Neurology

## 2024-01-28 ENCOUNTER — Encounter: Payer: Self-pay | Admitting: Neurology

## 2024-01-28 VITALS — BP 132/69 | HR 87 | Ht 62.0 in | Wt 122.0 lb

## 2024-01-28 DIAGNOSIS — G44329 Chronic post-traumatic headache, not intractable: Secondary | ICD-10-CM | POA: Diagnosis not present

## 2024-01-28 DIAGNOSIS — G5601 Carpal tunnel syndrome, right upper limb: Secondary | ICD-10-CM | POA: Diagnosis not present

## 2024-01-28 NOTE — Progress Notes (Signed)
 "        Provider:  Dedra Gores, MD  Primary Care Physician:  Dayna Motto, DO [address] [place] [address]     Referring Provider: Dayna Motto, Do [address] [place],  [place] [address]          Chief Complaint according to patient   Patient presents with:          reports her headaches have improved since last visit. She may have 2 headaches a week.       HISTORY OF PRESENT ILLNESS:  Erin Costa is a 49 y.o. female patient who is  working for [employer] as a merchandiser, retail housing , and here for revisit 01/28/2024 related to post- traumatic headaches have improved to 2 times a week. Her  right hand ( right handed individual) is now in pain and she lost grip strength and it  affects typing at work.  She has trouble to  style her hair, couldn't use the bobby pin.  Dr. Ines had seen her for carpal tunnel as well, and I am offering today a referral to hand-surgery.     Chief concern according to patient :  improving  headaches -but needs help with carpal tunnel.   Magnesium -glyconate po has helped with sleep ( I had originally seen her in a sleep consultation by Dr Ines ).      06-06-2023: HST did detect very infrequent apneas/ hypopneas within the physiological norm. A diagnosis of sleep apnea was not made. No physiologic cause for a sleep disorder was found. A follow up in the sleep clinic is not needed.     Erin Costa is a 49 y.o. female here for evaluation of head injury.  Has been dealing with coughing and sneezing due to seasonal allergies.  Was at work pushed back from her chair as she thought she was going to sneeze and when she did she subsequently hit her forehead on the desk.  Tetanus is up-to-date.  She has had a diffuse headache since.  She does note history of migraine as well as recurrent headache due to concussion per neurology note follows with Dr. Ines.  No numbness, weakness, slurred speech.  No neck pain   Review of  Systems: Out of a complete 14 system review, the patient complains of only the following symptoms, and all other reviewed systems are negative.:   SLEEPINESS ?  How likely are you to doze in the following situations: 0 = not likely, 1 = slight chance, 2 = moderate chance, 3 = high chance  Sitting and Reading? Watching Television? Sitting inactive in a public place (theater or meeting)? Lying down in the afternoon when circumstances permit? Sitting and talking to someone? Sitting quietly after lunch without alcohol? In a car, while stopped for a few minutes in traffic? As a passenger in a car for an hour without a break?  Total = 6/ 24   FSS   49 / 63        Social History   Socioeconomic History   Marital status: Married    Spouse name: Not on file   Number of children: 2   Years of education: Not on file   Highest education level: Master's degree (e.g., MA, MS, MEng, MEd, MSW, MBA)  Occupational History   Occupation: child psychotherapist   Tobacco Use   Smoking status: Never   Smokeless tobacco: Never  Vaping Use   Vaping status: Never  Used  Substance and Sexual Activity   Alcohol use: Yes    Comment: wine occasionally   Drug use: No   Sexual activity: Not on file  Other Topics Concern   Not on file  Social History Narrative   Lives at home with husband and 2 children   Right handed   Caffeine: 1 cup coffee daily and green tea <20 oz. Daily    Social Drivers of Health   Tobacco Use: Low Risk (01/28/2024)   Patient History    Smoking Tobacco Use: Never    Smokeless Tobacco Use: Never    Passive Exposure: Not on file  Financial Resource Strain: Not on file  Food Insecurity: Not on file  Transportation Needs: Not on file  Physical Activity: Not on file  Stress: Not on file  Social Connections: Not on file  Depression (EYV7-0): Not on file  Alcohol Screen: Not on file  Housing: Not on file  Utilities: Not on file  Health Literacy: Not on file    Family  History  Problem Relation Age of Onset   Stroke Mother    Cancer Father        lung cancer   Dementia Maternal Aunt    Stroke Maternal Grandmother    Graves' disease Sister     Past Medical History:  Diagnosis Date   Anemia    hx of    Back pain    Bursitis    to left hip   GERD (gastroesophageal reflux disease)    Headache    Heart murmur    Hypoglycemia    Hypoglycemia    Kidney stones    calcium oxalate and one other kind   Palpitations    Vitamin D  deficiency     Past Surgical History:  Procedure Laterality Date   CYSTOSCOPY WITH RETROGRADE PYELOGRAM, URETEROSCOPY AND STENT PLACEMENT Bilateral 05/10/2016   Procedure: CYSTOSCOPY WITH RETROGRADE PYELOGRAM, URETEROSCOPY, BASKET EXTRACTION OF STONE ON LEFT;  Surgeon: Belvie LITTIE Clara, MD;  Location: [facility];  Service: Urology;  Laterality: Bilateral;   UTERINE FIBROID SURGERY       Medications Ordered Prior to Encounter[1]  Allergies[2]   DIAGNOSTIC DATA (LABS, IMAGING, TESTING) - I reviewed patient records, labs, notes, testing and imaging myself where available.  Lab Results  Component Value Date   WBC 8.1 09/24/2023   HGB 11.6 (L) 09/24/2023   HCT 37.2 09/24/2023   MCV 84.5 09/24/2023   PLT 252 09/24/2023      Component Value Date/Time   NA 140 09/24/2023 2024   NA 141 06/23/2017 1401   K 4.8 09/24/2023 2024   CL 106 09/24/2023 2024   CO2 24 09/24/2023 2024   GLUCOSE 86 09/24/2023 2024   BUN 16 09/24/2023 2024   BUN 17 06/23/2017 1401   CREATININE 0.64 09/24/2023 2024   CREATININE 0.84 07/14/2018 1042   CALCIUM 9.2 09/24/2023 2024   PROT 7.5 09/24/2023 2024   PROT 7.3 06/23/2017 1401   ALBUMIN 4.5 09/24/2023 2024   ALBUMIN 4.5 06/23/2017 1401   AST 18 09/24/2023 2024   AST 22 07/14/2018 1042   ALT 12 09/24/2023 2024   ALT 22 07/14/2018 1042   ALKPHOS 51 09/24/2023 2024   BILITOT 0.3 09/24/2023 2024   BILITOT 0.5 07/14/2018 1042   GFRNONAA >60 09/24/2023 2024   GFRNONAA >60 07/14/2018 1042    GFRAA >60 07/14/2018 1042   No results found for: CHOL, HDL, LDLCALC, LDLDIRECT, TRIG, CHOLHDL Lab Results  Component Value Date  HGBA1C 5.7 (H) 01/18/2020   Lab Results  Component Value Date   VITAMINB12 693 01/18/2020   Lab Results  Component Value Date   TSH 0.768 06/23/2017    PHYSICAL EXAM:  Vitals:   01/28/24 0827  BP: 132/69  Pulse: 87   No data found. Body mass index is 22.31 kg/m.   Wt Readings from Last 3 Encounters:  01/28/24 122 lb (55.3 kg)  09/24/23 124 lb 12.8 oz (56.6 kg)  03/24/23 123 lb (55.8 kg)     Ht Readings from Last 3 Encounters:  01/28/24 5' 2 (1.575 m)  09/24/23 5' 2 (1.575 m)  03/24/23 5' 0.2 (1.529 m)      General: The patient is awake, alert and appears not in acute distress and groomed. Head: Normocephalic, atraumatic.  Neck is supple. Mallampati 3,  neck circumference:13. 5 inches . Nasal airflow fully patent.   Retrognathia is  seen.  Dental status:  biological  Cardiovascular:  Regular rate and cardiac rhythm by pulse,  without distended neck veins. Respiratory: Lungs are clear to auscultation.  Skin:  Without evidence of ankle edema, or rash. Trunk: The patient's posture is erect.   NEUROLOGIC EXAM: The patient is awake and alert, oriented to place and time.   Memory subjective described as intact.  Attention span & concentration ability appears normal.  Speech is fluent,  without  dysarthria, dysphonia or aphasia.  Mood and affect are appropriate.   Cranial nerves: no loss of smell or taste reported  Pupils are equal and briskly reactive to light. Funduscopic exam deferred. .  Extraocular movements in vertical and horizontal planes were intact and without nystagmus. No Diplopia. Visual fields by finger perimetry are intact. Hearing was intact to soft voice and finger rubbing.  Right ear pressure also cased some hearing deficits.  Facial sensation intact to fine touch. She reports a new sensation over  the right midface, not affecting sleep. ear pressure.  Facial motor strength is symmetric and tongue and uvula move midline.  Neck ROM : rotation, tilt and flexion extension were normal for age and shoulder shrug was symmetrical.    Motor exam:  Symmetric bulk, tone and ROM.   Normal tone without cog- wheeling, asymmetric grip strength . Left hand decreased over right. Carpaltunnel.    Sensory:   right hand  numbness, in 4 and 5th finger, pain however shoots from the elbow down (?)  Fine touch affected- fnger tips numbness in part of middle finger and ring finger and small-finger berry. , vibration normal- tested  : nl present in ankles and hands/ fingertips.  Proprioception tested in the upper extremitieswas normal.   Coordination: Rapid alternating movements in the fingers/hands were of normal speed.  The Finger-to-nose maneuver was intact without evidence of ataxia, dysmetria or tremor.   Gait and Station: Normal. Romberg's sign is absent.   ASSESSMENT AND PLAN :   49 y.o. year old female  here with:    1) Carpal tunnel worsening , previously affecting the left  more than the right.   Worsening on the right.   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    3) neck pain on and off.  Cervical spine arthritis-    4) perimenopausal - sleep affected, chronic insomnia. Magnesium helps.   Plan : referral to hand surgeon -   PRN revisit.     I would like to thank Dayna Motto, DO and Dayna Motto, Do [address] [place],  [place] [address] for allowing me  to meet with this pleasant patient.   Sleep Clinic Patients are generally offered input on sleep hygiene, life style changes and how to improve compliance with medical treatment where applicable. Review and reiteration of good sleep hygiene measures is offered to any sleep clinic patient, be it in the first consultation or with any follow up visits.    Any patient with sleepiness should be cautioned not to drive, work at  heights, or operate dangerous or heavy equipment when feeling tired or sleepy.      The patient will be seen in follow-up in the sleep clinic at [facility] for discussion of test results, sleep related symptoms and treatment compliance review, further management strategies, etc.   The referring provider will be notified of the test results.   The patient's condition requires frequent monitoring and adjustments in the treatment plan, reflecting the ongoing complexity of care.  This provider is the continuing focal point for all needed services for this condition.  After spending a total time of  24  minutes face to face and time for  history taking, physical and neurologic examination, review of laboratory studies,  personal review of imaging studies, reports and results of other testing and review of referral information / records as far as provided in visit,   Electronically signed by: Dedra Gores, MD 01/28/2024 8:42 AM  [facility] Board certified by The [employer] of Sleep Medicine and Diplomate of the [employer] of Sleep Medicine. Board certified In Neurology through the ABPN, Fellow of the [employer] of Neu[employer].       [1]  Current Outpatient Medications on File Prior to Visit  Medication Sig Dispense Refill   cetirizine (ZYRTEC) 10 MG tablet Take 10 mg by mouth daily as needed.     ibuprofen  (ADVIL ) 600 MG tablet Take 1 tablet (600 mg total) by mouth every 6 (six) hours as needed. 30 tablet 0   levonorgestrel (MIRENA) 20 MCG/24HR IUD 1 each by Intrauterine route once.     No current facility-administered medications on file prior to visit.  [2]  Allergies Allergen Reactions   Dovonex [Calcipotriene]     rash   Other     Dovinex cream, reaction   "

## 2024-01-28 NOTE — Telephone Encounter (Signed)
 Referral for hand surgery to see Dr Murrell at [facility]. Phone: [phone], [phone]

## 2024-01-28 NOTE — Patient Instructions (Addendum)
 ASSESSMENT AND PLAN :   49 y.o. year old female  here with:    1) Carpal tunnel worsening , previously affecting the left  more than the right.   Worsening on the right.   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    3) neck pain on and off.  Cervical spine arthritis-    4) perimenopausal - sleep affected, chronic insomnia. Magnesium helps.   Plan : referral to hand surgeon -   PRN revisit.     I would like to thank Dayna Motto, DO and Dayna Motto, Do [address] [place],  [place] [address] for allowing me to meet with this pleasant patient.   Sleep Clinic Patients are generally offered input on sleep hygiene, life style changes and how to improve compliance with medical treatment where applicable. Review and reiteration of good sleep hygiene measures is offered to any sleep clinic patient, be it in the first consultation or with any follow up visits.    Any patient with sleepiness should be cautioned not to drive, work at heights, or operate dangerous or heavy equipment when feeling tired or sleepy.      The patient will be seen in follow-up in the sleep clinic at [facility] for discussion of test results, sleep related symptoms and treatment compliance review, further management strategies, etc.    Insomnia Insomnia is a sleep disorder that makes it difficult to fall asleep or stay asleep. Insomnia can cause fatigue, low energy, difficulty concentrating, mood swings, and poor performance at work or school. There are three different ways to classify insomnia: Difficulty falling asleep. Difficulty staying asleep. Waking up too early in the morning. Any type of insomnia can be long-term (chronic) or short-term (acute). Both are common. Short-term insomnia usually lasts for 3 months or less. Chronic insomnia occurs at least three times a week for longer than 3 months. What are the causes? Insomnia may be caused by another condition, situation, or substance, such as: Having  certain mental health conditions, such as anxiety and depression. Using caffeine, alcohol, tobacco, or drugs. Having gastrointestinal conditions, such as gastroesophageal reflux disease (GERD). Having certain medical conditions. These include: Asthma. Alzheimer's disease. Stroke. Chronic pain. An overactive thyroid  gland (hyperthyroidism). Other sleep disorders, such as restless legs syndrome and sleep apnea. Menopause. Sometimes, the cause of insomnia may not be known. What increases the risk? Risk factors for insomnia include: Gender. Females are affected more often than males. Age. Insomnia is more common as people get older. Stress and certain medical and mental health conditions. Lack of exercise. Having an irregular work schedule. This may include working night shifts and traveling between different time zones. What are the signs or symptoms? If you have insomnia, the main symptom is having trouble falling asleep or having trouble staying asleep. This may lead to other symptoms, such as: Feeling tired or having low energy. Feeling nervous about going to sleep. Not feeling rested in the morning. Having trouble concentrating. Feeling irritable, anxious, or depressed. How is this diagnosed? This condition may be diagnosed based on: Your symptoms and medical history. Your health care provider may ask about: Your sleep habits. Any medical conditions you have. Your mental health. A physical exam. How is this treated? Treatment for insomnia depends on the cause. Treatment may focus on treating an underlying condition that is causing the insomnia. Treatment may also include: Medicines to help you sleep. Counseling or therapy. Lifestyle adjustments to help you sleep better. Follow these instructions at home: Eating and  drinking  Limit or avoid alcohol, caffeinated beverages, and products that contain nicotine and tobacco, especially close to bedtime. These can disrupt your  sleep. Do not eat a large meal or eat spicy foods right before bedtime. This can lead to digestive discomfort that can make it hard for you to sleep. Sleep habits  Keep a sleep diary to help you and your health care provider figure out what could be causing your insomnia. Write down: When you sleep. When you wake up during the night. How well you sleep and how rested you feel the next day. Any side effects of medicines you are taking. What you eat and drink. Make your bedroom a dark, comfortable place where it is easy to fall asleep. Put up shades or blackout curtains to block light from outside. Use a white noise machine to block noise. Keep the temperature cool. Limit screen use before bedtime. This includes: Not watching TV. Not using your smartphone, tablet, or computer. Stick to a routine that includes going to bed and waking up at the same times every day and night. This can help you fall asleep faster. Consider making a quiet activity, such as reading, part of your nighttime routine. Try to avoid taking naps during the day so that you sleep better at night. Get out of bed if you are still awake after 15 minutes of trying to sleep. Keep the lights down, but try reading or doing a quiet activity. When you feel sleepy, go back to bed. General instructions Take over-the-counter and prescription medicines only as told by your health care provider. Exercise regularly as told by your health care provider. However, avoid exercising in the hours right before bedtime. Use relaxation techniques to manage stress. Ask your health care provider to suggest some techniques that may work well for you. These may include: Breathing exercises. Routines to release muscle tension. Visualizing peaceful scenes. Make sure that you drive carefully. Do not drive if you feel very sleepy. Keep all follow-up visits. This is important. Contact a health care provider if: You are tired throughout the day. You  have trouble in your daily routine due to sleepiness. You continue to have sleep problems, or your sleep problems get worse. Get help right away if: You have thoughts about hurting yourself or someone else. Get help right away if you feel like you may hurt yourself or others, or have thoughts about taking your own life. Go to your nearest emergency room or: Call 911. Call the National Suicide Prevention Lifeline at 773-577-3967 or 988. This is open 24 hours a day. Text the Crisis Text Line at (469) 493-9448. Summary Insomnia is a sleep disorder that makes it difficult to fall asleep or stay asleep. Insomnia can be long-term (chronic) or short-term (acute). Treatment for insomnia depends on the cause. Treatment may focus on treating an underlying condition that is causing the insomnia. Keep a sleep diary to help you and your health care provider figure out what could be causing your insomnia. This information is not intended to replace advice given to you by your health care provider. Make sure you discuss any questions you have with your health care provider. Document Revised: 12/11/2020 Document Reviewed: 12/11/2020 Elsevier Patient Education  2024 [employer].
